# Patient Record
Sex: Female | Born: 1990
Health system: Southern US, Community
[De-identification: ages and names within clinical notes are randomized; demographics above are authoritative.]

## PROBLEM LIST (undated history)

## (undated) DIAGNOSIS — N361 Urethral diverticulum: Secondary | ICD-10-CM

## (undated) HISTORY — DX: Urethral diverticulum: N36.1

## (undated) HISTORY — PX: COSMETIC SURGERY: SHX468

---
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
## 2008-08-01 ENCOUNTER — Emergency Department (HOSPITAL_COMMUNITY): Admission: EM | Admit: 2008-08-01 | Discharge: 2008-08-01 | Payer: Self-pay | Admitting: Emergency Medicine

## 2011-09-11 ENCOUNTER — Emergency Department: Payer: Self-pay | Admitting: *Deleted

## 2011-09-11 LAB — URINALYSIS, COMPLETE
Bacteria: NONE SEEN
Bilirubin,UR: NEGATIVE
Ketone: NEGATIVE
Leukocyte Esterase: NEGATIVE
Nitrite: NEGATIVE
Protein: NEGATIVE
Specific Gravity: 1.018 (ref 1.003–1.030)
Squamous Epithelial: 5
WBC UR: 2 /HPF (ref 0–5)

## 2011-10-20 ENCOUNTER — Emergency Department (HOSPITAL_COMMUNITY): Payer: Self-pay

## 2011-10-20 ENCOUNTER — Encounter (HOSPITAL_COMMUNITY): Payer: Self-pay | Admitting: *Deleted

## 2011-10-20 ENCOUNTER — Emergency Department (HOSPITAL_COMMUNITY)
Admission: EM | Admit: 2011-10-20 | Discharge: 2011-10-20 | Disposition: A | Discharge status: Home / Self Care | Payer: Self-pay | Attending: Emergency Medicine | Admitting: Emergency Medicine

## 2011-10-20 DIAGNOSIS — Z3490 Encounter for supervision of normal pregnancy, unspecified, unspecified trimester: Secondary | ICD-10-CM

## 2011-10-20 DIAGNOSIS — IMO0002 Reserved for concepts with insufficient information to code with codable children: Secondary | ICD-10-CM | POA: Insufficient documentation

## 2011-10-20 DIAGNOSIS — S63509A Unspecified sprain of unspecified wrist, initial encounter: Secondary | ICD-10-CM | POA: Insufficient documentation

## 2011-10-20 DIAGNOSIS — S63501A Unspecified sprain of right wrist, initial encounter: Secondary | ICD-10-CM

## 2011-10-20 DIAGNOSIS — O219 Vomiting of pregnancy, unspecified: Secondary | ICD-10-CM | POA: Insufficient documentation

## 2011-10-20 DIAGNOSIS — S40212A Abrasion of left shoulder, initial encounter: Secondary | ICD-10-CM
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
## 2011-10-20 NOTE — ED Notes (Signed)
Orth tech notified of need for splint.

## 2011-10-20 NOTE — ED Notes (Signed)
D/c instructions reviewed w/ pt - pt denies any further questions or concerns at present.   
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
## 2012-10-22 ENCOUNTER — Emergency Department: Payer: Self-pay | Admitting: Internal Medicine

## 2012-10-22 LAB — URINALYSIS, COMPLETE
Bacteria: NONE SEEN
Glucose,UR: NEGATIVE mg/dL (ref 0–75)
Leukocyte Esterase: NEGATIVE
Nitrite: NEGATIVE
Ph: 6 (ref 4.5–8.0)
Protein: 30
Specific Gravity: 1.02 (ref 1.003–1.030)
Squamous Epithelial: 2

## 2012-10-23 ENCOUNTER — Emergency Department: Payer: Self-pay | Admitting: Unknown Physician Specialty

## 2014-04-12 ENCOUNTER — Encounter (HOSPITAL_COMMUNITY): Payer: Self-pay | Admitting: *Deleted

## 2015-05-11 ENCOUNTER — Emergency Department
Admission: EM | Admit: 2015-05-11 | Discharge: 2015-05-11 | Disposition: A | Discharge status: Home / Self Care | Payer: No Typology Code available for payment source | Attending: Student | Admitting: Student

## 2015-05-11 ENCOUNTER — Encounter: Payer: Self-pay | Admitting: *Deleted

## 2015-05-11 DIAGNOSIS — G44311 Acute post-traumatic headache, intractable: Secondary | ICD-10-CM | POA: Insufficient documentation

## 2015-05-11 DIAGNOSIS — Y92410 Unspecified street and highway as the place of occurrence of the external cause: Secondary | ICD-10-CM | POA: Diagnosis not present

## 2015-05-11 DIAGNOSIS — Z79899 Other long term (current) drug therapy: Secondary | ICD-10-CM | POA: Insufficient documentation

## 2015-05-11 DIAGNOSIS — S3992XA Unspecified injury of lower back, initial encounter: Secondary | ICD-10-CM | POA: Diagnosis present

## 2015-05-11 DIAGNOSIS — Y998 Other external cause status: Secondary | ICD-10-CM | POA: Insufficient documentation

## 2015-05-11 DIAGNOSIS — Y9389 Activity, other specified: Secondary | ICD-10-CM | POA: Insufficient documentation

## 2015-05-11 DIAGNOSIS — S199XXA Unspecified injury of neck, initial encounter: Secondary | ICD-10-CM | POA: Insufficient documentation

## 2015-05-11 DIAGNOSIS — M7918 Myalgia, other site: Secondary | ICD-10-CM

## 2015-05-11 MED ORDER — NAPROXEN 500 MG PO TABS: 500.0000 mg | ORAL_TABLET | Freq: Two times a day (BID) | ORAL | Status: AC

## 2015-05-11 MED ORDER — CYCLOBENZAPRINE HCL 10 MG PO TABS: 10.0000 mg | ORAL_TABLET | Freq: Three times a day (TID) | ORAL | Status: DC | PRN

## 2015-05-11 MED ORDER — TRAMADOL HCL 50 MG PO TABS: 50.0000 mg | ORAL_TABLET | Freq: Four times a day (QID) | ORAL | Status: DC | PRN

## 2015-05-11 NOTE — Discharge Instructions (Signed)
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
## 2016-01-06 ENCOUNTER — Encounter: Payer: Self-pay | Admitting: Urology

## 2016-01-06 ENCOUNTER — Ambulatory Visit: Payer: Self-pay | Admitting: Urology

## 2016-02-27 DIAGNOSIS — E559 Vitamin D deficiency, unspecified: Secondary | ICD-10-CM | POA: Insufficient documentation

## 2017-03-18 ENCOUNTER — Telehealth: Payer: Self-pay | Admitting: Obstetrics and Gynecology

## 2017-03-18 NOTE — Telephone Encounter (Signed)
Called and lvm for patient to schedule appointment, Our office received a referral from [facility]. No other information was disclosed. Will continue to reach out to patient to schedule future appointments. Thank you.

## 2017-03-19 ENCOUNTER — Ambulatory Visit (INDEPENDENT_AMBULATORY_CARE_PROVIDER_SITE_OTHER): Payer: Medicaid Other | Admitting: Obstetrics and Gynecology

## 2017-03-19 ENCOUNTER — Encounter: Payer: Self-pay | Admitting: Obstetrics and Gynecology

## 2017-03-19 VITALS — BP 94/60 | HR 87 | Ht 64.0 in | Wt 168.2 lb

## 2017-03-19 DIAGNOSIS — N361 Urethral diverticulum: Secondary | ICD-10-CM | POA: Diagnosis not present

## 2017-03-19 NOTE — Progress Notes (Signed)
HPI:      [name] is a 26 y.o. [phone] who LMP was Patient's last menstrual period was 03/07/2017 (approximate).  Subjective:   She presents today With 2 issues.  She palpated on her cervix a small bump and was diagnosed with a nabothian cyst. She would like to know management of this. Her main issue is which she describes as extra skin in the middle of her vagina. She states that this area occasionally gets filled up with something becomes more obvious. She also complains that occasionally and spontaneously she will have a gush of fluid flow from the vagina. She reports no other urinary difficulties. She has recently been tested for STDs and denies new sexual partners. She has an IUD for birth control.    Hx: The following portions of the patient's history were reviewed and updated as appropriate:             She  has no past medical history on file. She  does not have a problem list on file. She  has a past surgical history that includes Cholecystectomy. Her family history includes Diabetes in her maternal grandmother; Migraines in her maternal grandmother. She  reports that she has never smoked. She has never used smokeless tobacco. She reports that she drinks alcohol. She reports that she does not use drugs. She has No Known Allergies.       Review of Systems:  Review of Systems  Constitutional: Denied constitutional symptoms, night sweats, recent illness, fatigue, fever, insomnia and weight loss.  Eyes: Denied eye symptoms, eye pain, photophobia, vision change and visual disturbance.  Ears/Nose/Throat/Neck: Denied ear, nose, throat or neck symptoms, hearing loss, nasal discharge, sinus congestion and sore throat.  Cardiovascular: Denied cardiovascular symptoms, arrhythmia, chest pain/pressure, edema, exercise intolerance, orthopnea and palpitations.  Respiratory: Denied pulmonary symptoms, asthma, pleuritic pain, productive sputum, cough, dyspnea and wheezing.   Gastrointestinal: Denied, gastro-esophageal reflux, melena, nausea and vomiting.  Genitourinary: See HPI for additional information.  Musculoskeletal: Denied musculoskeletal symptoms, stiffness, swelling, muscle weakness and myalgia.  Dermatologic: Denied dermatology symptoms, rash and scar.  Neurologic: Denied neurology symptoms, dizziness, headache, neck pain and syncope.  Psychiatric: Denied psychiatric symptoms, anxiety and depression.  Endocrine: Denied endocrine symptoms including hot flashes and night sweats.   Meds:   No current outpatient prescriptions on file prior to visit.   No current facility-administered medications on file prior to visit.     Objective:     Vitals:   03/19/17 1041  BP: 94/60  Pulse: 87              Physical examination   Pelvic:   Vulva: Normal appearance.  No lesions.  Vagina: No lesions or abnormalities noted.  Support: Normal pelvic support.  Urethra Skin tag type tissue mass noted below urethra. No fluid noted from the urethra with pressure on the skin tag.   Strong possibility of urethral diverticulum .    Meatus Normal size without lesions or prolapse.  Cervix: Normal appearance.  No lesions. Small nabothian cyst noted.  IUD strings noted   Anus: Normal exam.  No lesions.  Perineum: Normal exam.  No lesions.        Bimanual   Uterus: Normal size.  Non-tender.  Mobile.  AV.  Adnexae: No masses.  Non-tender to palpation.  Cul-de-sac: Negative for abnormality.     Assessment:    G2P2002 There are no active problems to display for this patient.    1. Urethral diverticulum  Possible Skene's gland issue but I doubt this. The appearance and the history of leakage most consistent with urethral diverticulum.   Plan:            1.  Referral to urology for further evaluation.  2.  Reassured patient regarding nabothian cysts-she declined any type of management and I agree that no specific management is required. Orders Orders  Placed This Encounter  Procedures  . Ambulatory referral to Urology    No orders of the defined types were placed in this encounter.     F/U  No Follow-up on file. I spent 33 minutes with this patient of which greater than 50% was spent discussing her signs and symptoms, nabothian cysts, urethral diverticulum, possible management.  Elonda Husky, M.D. 03/19/2017 11:52 AM

## 2017-04-04 ENCOUNTER — Ambulatory Visit (INDEPENDENT_AMBULATORY_CARE_PROVIDER_SITE_OTHER): Payer: Medicaid Other | Admitting: Urology

## 2017-04-04 ENCOUNTER — Encounter: Payer: Self-pay | Admitting: Urology

## 2017-04-04 DIAGNOSIS — N361 Urethral diverticulum: Secondary | ICD-10-CM

## 2017-04-04 NOTE — Progress Notes (Signed)
04/04/2017 1:49 PM   Monique Luna September 29, 1990 [id number]  Referring provider: Linzie Collin, MD [address] [place], [place] [address]  Chief Complaint  Patient presents with  . Urethral Diverticulum    New patient    HPI: Monique Luna  is a 26 yo female seen in consultation at the request of Dr. Logan Bores for evaluation of a possible urethral diverticulum.  She has a greater than five-year history of a periurethral mass.  She states approximately 3 years ago the area filled up with fluid then ruptured.  Since that time she has redundant periurethral tissue and occasional feels like the leakage is present.  She will note dampness in her underwear of slightly tinged fluid but no blood.  She has no voiding complaints and specifically denies urinary frequency, urgency, dysuria or gross hematuria.  She denies pelvic pain or dyspareunia.  There is no history of previous or recurrent UTIs.   PMH: No past medical history on file.  Surgical History: Past Surgical History:  Procedure Laterality Date  . CHOLECYSTECTOMY      Home Medications:  Allergies as of 04/04/2017   No Known Allergies     Medication List       Accurate as of 04/04/17  1:49 PM. Always use your most recent med list.          PARAGARD INTRAUTERINE COPPER Iud IUD 1 each by Intrauterine route once.       Allergies: No Known Allergies  Family History: Family History  Problem Relation Age of Onset  . Diabetes Maternal Grandmother   . Migraines Maternal Grandmother     Social History:  reports that she has never smoked. She has never used smokeless tobacco. She reports that she drinks alcohol. She reports that she does not use drugs.  ROS: UROLOGY Frequent Urination?: No Hard to postpone urination?: No Burning/pain with urination?: No Get up at night to urinate?: Yes Leakage of urine?: Yes Urine stream starts and stops?: No Trouble starting stream?: No Do you have to  strain to urinate?: No Blood in urine?: No Urinary tract infection?: No Sexually transmitted disease?: No Injury to kidneys or bladder?: No Painful intercourse?: No Weak stream?: No Currently pregnant?: No Vaginal bleeding?: No Last menstrual period?: n  Gastrointestinal Nausea?: No Vomiting?: No Indigestion/heartburn?: No Diarrhea?: No Constipation?: No  Constitutional Fever: No Night sweats?: No Weight loss?: No Fatigue?: Yes  Skin Skin rash/lesions?: No Itching?: No  Eyes Blurred vision?: No Double vision?: No  Ears/Nose/Throat Sore throat?: No Sinus problems?: No  Hematologic/Lymphatic Swollen glands?: No Easy bruising?: No  Cardiovascular Leg swelling?: No Chest pain?: No  Respiratory Cough?: No Shortness of breath?: No  Endocrine Excessive thirst?: No  Musculoskeletal Back pain?: No Joint pain?: No  Neurological Headaches?: No Dizziness?: No  Psychologic Depression?: No Anxiety?: No  Physical Exam: BP 101/64   Pulse 90   Ht 5\' 5"  (1.651 m)   Wt 168 lb (76.2 kg)   LMP 03/07/2017 (Approximate)   BMI 27.96 kg/m   Constitutional:  Alert and oriented, No acute distress. HEENT: [place] AT, moist mucus membranes.  Trachea midline, no masses. Cardiovascular: No clubbing, cyanosis, or edema. Respiratory: Normal respiratory effort, no increased work of breathing. GI: Abdomen is soft, nontender, nondistended, no abdominal masses GU: No CVA tenderness.  Pelvic exam deferred today Skin: No rashes, bruises or suspicious lesions. Lymph: No cervical or inguinal adenopathy. Neurologic: Grossly intact, no focal deficits, moving all 4 extremities. Psychiatric: Normal mood  and affect.  Laboratory Data:  Urinalysis Lab Results  Component Value Date   APPEARANCEUR Clear 10/22/2012   LEUKOCYTESUR Negative 10/22/2012   PROTEINUR 30 mg/dL 16/10/960405/14/2014   GLUCOSEU Negative 10/22/2012   RBCU 1 /HPF 10/22/2012   BILIRUBINUR Negative 10/22/2012   NITRITE  Negative 10/22/2012    Lab Results  Component Value Date   BACTERIA NONE SEEN 10/22/2012     Assessment & Plan:    1. Urethral diverticulum We discussed urethral diverticulum and diagnosis.  It is difficult to actually diagnose without imaging and have recommended scheduling an MRI of the pelvis.  She will follow-up after the study for cystoscopy and pelvic exam.  - MR Pelvis W Wo Contrast; Future    Riki AltesScott C Trina Asch, MD    [facility] [address], [address] [place], [place] [id number] [phone]

## 2017-04-25 ENCOUNTER — Encounter: Payer: Self-pay | Admitting: Urology

## 2017-04-25 ENCOUNTER — Other Ambulatory Visit: Payer: Medicaid Other | Admitting: Urology

## 2017-05-03 ENCOUNTER — Ambulatory Visit: Payer: Medicaid Other

## 2017-05-10 ENCOUNTER — Ambulatory Visit
Admission: RE | Admit: 2017-05-10 | Discharge: 2017-05-10 | Disposition: A | Discharge status: Home / Self Care | Payer: Medicaid Other | Source: Ambulatory Visit | Attending: Urology | Admitting: Urology

## 2017-05-10 DIAGNOSIS — N361 Urethral diverticulum: Secondary | ICD-10-CM

## 2017-05-10 MED ORDER — GADOBENATE DIMEGLUMINE 529 MG/ML IV SOLN
15.0000 mL | Freq: Once | INTRAVENOUS | Status: AC | PRN
  Administered 2017-05-10: 15 mL via INTRAVENOUS

## 2017-05-17 ENCOUNTER — Encounter: Payer: Self-pay | Admitting: Urology

## 2017-05-17 ENCOUNTER — Ambulatory Visit (INDEPENDENT_AMBULATORY_CARE_PROVIDER_SITE_OTHER): Payer: Medicaid Other | Admitting: Urology

## 2017-05-17 VITALS — BP 119/81 | HR 90 | Ht 65.0 in | Wt 168.7 lb

## 2017-05-17 DIAGNOSIS — N2889 Other specified disorders of kidney and ureter: Secondary | ICD-10-CM

## 2017-05-17 LAB — URINALYSIS, COMPLETE
BILIRUBIN UA: NEGATIVE
GLUCOSE, UA: NEGATIVE
LEUKOCYTES UA: NEGATIVE
Nitrite, UA: NEGATIVE
Protein, UA: NEGATIVE
RBC UA: NEGATIVE
Urobilinogen, Ur: 0.2 mg/dL (ref 0.2–1.0)
pH, UA: 6 (ref 5.0–7.5)

## 2017-05-17 LAB — MICROSCOPIC EXAMINATION: RBC, UA: NONE SEEN /hpf (ref 0–?)

## 2017-05-17 MED ORDER — LIDOCAINE HCL 2 % EX GEL
1.0000 "application " | Freq: Once | CUTANEOUS | Status: AC
  Administered 2017-05-17: 1 via URETHRAL

## 2017-05-21 NOTE — Progress Notes (Signed)
   05/21/17  CC:  Chief Complaint  Patient presents with  . Cysto    HPI: Initially seen 04/04/2017 for a possible urethral diverticulum.  Blood pressure 119/81, pulse 90, height 5\' 5"  (1.651 m), weight 168 lb 11.2 oz (76.5 kg), unknown if currently breastfeeding. NED. A&Ox3.   Pelvic-saccular tissue posterior aspect of urethra  Pelvic MRI shows a small 4 x 7 mm posterior right-sided urethral diverticulum  Cystoscopy Procedure Note  Patient identification was confirmed, informed consent was obtained, and patient was prepped using Betadine solution.  Lidocaine jelly was administered per urethral meatus.    Preoperative abx where received prior to procedure.    Procedure: - Flexible cystoscope introduced, without any difficulty.   - Thorough search of the bladder revealed:    normal urethral meatus-no os of urethral diverticulum visualized    normal urothelium    no stones    no ulcers     no tumors    no urethral polyps    no trabeculation  - Ureteral orifices were normal in position and appearance.  Post-Procedure: - Patient tolerated the procedure well  Assessment/ Plan: Small urethral diverticulum.  Will have her see Dr. Sherron MondayMacDiarmid next to discuss management options.    Riki AltesScott C Diogo Anne, MD

## 2017-06-24 ENCOUNTER — Ambulatory Visit: Payer: Self-pay | Admitting: Urology

## 2017-07-01 ENCOUNTER — Encounter: Payer: Self-pay | Admitting: Urology

## 2017-07-01 ENCOUNTER — Ambulatory Visit: Payer: Medicaid Other | Admitting: Urology

## 2017-08-19 ENCOUNTER — Telehealth: Payer: Self-pay | Admitting: Urology

## 2017-08-19 ENCOUNTER — Ambulatory Visit: Payer: Medicaid Other | Admitting: Urology

## 2017-08-19 NOTE — Telephone Encounter (Signed)
Pt left message with after hours asking to cancel/resched appt, I attempted to reach out/call pt x3 over a span of a lil over an hr, all of which were busy signals.  FYI did attempt to call to reschedule.

## 2017-10-07 ENCOUNTER — Encounter: Payer: Self-pay | Admitting: Urology

## 2017-10-07 ENCOUNTER — Ambulatory Visit (INDEPENDENT_AMBULATORY_CARE_PROVIDER_SITE_OTHER): Payer: Medicaid Other | Admitting: Urology

## 2017-10-07 VITALS — BP 105/68 | HR 84 | Resp 16 | Ht 65.0 in | Wt 163.2 lb

## 2017-10-07 DIAGNOSIS — N2889 Other specified disorders of kidney and ureter: Secondary | ICD-10-CM

## 2017-10-07 LAB — URINALYSIS, COMPLETE
BILIRUBIN UA: NEGATIVE
Glucose, UA: NEGATIVE
Ketones, UA: NEGATIVE
Leukocytes, UA: NEGATIVE
NITRITE UA: NEGATIVE
RBC UA: NEGATIVE
Specific Gravity, UA: >1.03 — ABNORMAL HIGH (ref 1.005–1.030)
UUROB: 0.2 mg/dL (ref 0.2–1.0)
pH, UA: 5.5 (ref 5.0–7.5)

## 2017-10-07 LAB — MICROSCOPIC EXAMINATION: WBC UA: NONE SEEN /HPF (ref 0–5)

## 2017-10-07 NOTE — Progress Notes (Signed)
10/07/2017 3:36 PM   Monique Luna January 30, 1991 [id number]  Referring provider: No referring provider defined for this encounter.  No chief complaint on file.   HPI: Dr Lonna Cobb: Monique Luna  is a 27 yo female seen in consultation at the request of Dr. Logan Bores for evaluation of a possible urethral diverticulum.  She has a greater than five-year history of a periurethral mass.  She states approximately 3 years ago the area filled up with fluid then ruptured.  Since that time she has redundant periurethral tissue and occasional feels like the leakage is present.  She will note dampness in her underwear of slightly tinged fluid but no blood.  She has no voiding complaints and specifically denies urinary frequency, urgency, dysuria or gross hematuria.  She denies pelvic pain or dyspareunia.  There is no history of previous or recurrent UTIs.  Cystoscopy December 7 was normal.  Urinary Tract: No distal hydroureter. Normal urinary bladder. A diminutive, approximately 7 x 4 mm cystic structure about the 7-8 o'clock position of the urethra including on image 25/ series 3 and 16/series 6. Not well evaluated after contrast secondary to size. No gross abnormal enhancement in this area. IMPRESSION: 7 x 4 mm posterior right-sided urethral diverticulum.  Patient describes twice perhaps a swelling or cyst in the vagina especially 10 years ago when she was pregnant.  She describes it draining twice the last being at least 3 years ago.  She does not feel a cyst now  She denies urge incontinence and stress incontinence but randomly has leakage not associated with awareness.  She will change the liner each time she voids sometimes damp sometimes more wet  Modifying factors: There are no other modifying factors  Associated signs and symptoms: There are no other associated signs and symptoms Aggravating and relieving factors: There are no other aggravating or relieving factors Severity:  Moderate Duration: Persistent      PMH: Past Medical History:  Diagnosis Date  . Urethral diverticulum     Surgical History: Past Surgical History:  Procedure Laterality Date  . CHOLECYSTECTOMY  2009   [facility]    Home Medications:  Allergies as of 10/07/2017   No Known Allergies     Medication List        Accurate as of 10/07/17  3:36 PM. Always use your most recent med list.          emtricitabine-tenofovir 200-300 MG tablet Commonly known as:  TRUVADA Take 1 tablet by mouth daily.   PARAGARD INTRAUTERINE COPPER Iud IUD 1 each by Intrauterine route once. Placed in 2018       Allergies: No Known Allergies  Family History: Family History  Problem Relation Age of Onset  . Diabetes Maternal Grandmother   . Migraines Maternal Grandmother   . Healthy Father   . Healthy Mother   . Healthy Sister   . Healthy Sister     Social History:  reports that she has never smoked. She has never used smokeless tobacco. She reports that she drinks alcohol. She reports that she does not use drugs.  ROS: UROLOGY Frequent Urination?: No Hard to postpone urination?: No Burning/pain with urination?: No Get up at night to urinate?: No Leakage of urine?: Yes Urine stream starts and stops?: No Trouble starting stream?: No Do you have to strain to urinate?: No Blood in urine?: No Urinary tract infection?: No Sexually transmitted disease?: No Injury to kidneys or bladder?: No Painful intercourse?: Yes Weak stream?: No Currently pregnant?:  No Vaginal bleeding?: No Last menstrual period?: n  Gastrointestinal Nausea?: No Vomiting?: No Indigestion/heartburn?: No Diarrhea?: No Constipation?: No  Constitutional Fever: No Night sweats?: No Weight loss?: No Fatigue?: No  Skin Skin rash/lesions?: No Itching?: No  Eyes Blurred vision?: No Double vision?: No  Ears/Nose/Throat Sore throat?: No Sinus problems?: No  Hematologic/Lymphatic Swollen  glands?: No Easy bruising?: No  Cardiovascular Leg swelling?: No Chest pain?: No  Respiratory Cough?: No Shortness of breath?: No  Endocrine Excessive thirst?: No  Musculoskeletal Back pain?: No Joint pain?: No  Neurological Headaches?: No Dizziness?: No  Psychologic Depression?: No Anxiety?: No  Physical Exam: BP 105/68   Pulse 84   Resp 16   Ht  (1.651 m)   Wt 163 lb 3.2 oz (74 kg)   SpO2 98%   BMI 27.16 kg/m   Constitutional:  Alert and oriented, No acute distress. HEENT: [place] AT, moist mucus membranes.  Trachea midline, no masses. Cardiovascular: No clubbing, cyanosis, or edema. Respiratory: Normal respiratory effort, no increased work of breathing. GI: Abdomen is soft, nontender, nondistended, no abdominal masses GU: The patient had a well supported bladder neck and no prolapse or stress incontinence.  She had a small swelling approximately 7 or 8 mm x 3 or 4 mm at the level of the urethral meatus in keeping with a Skene's duct cyst.  It was nontender and I could not express it Skin: No rashes, bruises or suspicious lesions. Lymph: No cervical or inguinal adenopathy. Neurologic: Grossly intact, no focal deficits, moving all 4 extremities. Psychiatric: Normal mood and affect.  Laboratory Data:  Urinalysis    Component Value Date/Time   COLORURINE Yellow 10/22/2012 1845   APPEARANCEUR Clear 05/17/2017 1622   LABSPEC 1.020 10/22/2012 1845   PHURINE 6.0 10/22/2012 1845   GLUCOSEU Negative 05/17/2017 1622   GLUCOSEU Negative 10/22/2012 1845   HGBUR Negative 10/22/2012 1845   BILIRUBINUR Negative 05/17/2017 1622   BILIRUBINUR Negative 10/22/2012 1845   KETONESUR Negative 10/22/2012 1845   PROTEINUR Negative 05/17/2017 1622   PROTEINUR 30 mg/dL 66/44/0347 4259   NITRITE Negative 05/17/2017 1622   NITRITE Negative 10/22/2012 1845   LEUKOCYTESUR Negative 05/17/2017 1622   LEUKOCYTESUR Negative 10/22/2012 1845    Pertinent Imaging: As  noted  Assessment & Plan: The patient by MRI and history appears to have had or has a urethral diverticulum.  She does have leakage not associated with awareness.  I reviewed the MRI and I do believe the findings coincide with the physical examination.  My concern is that if she has a diverticulum separate from what I saw at the meatus it is too small to treat at this stage.  The distal finding if not a diverticulum should not cause urinary incontinence.  It would be very rare for diverticulum to be there is distal.  I can see why she does have a spraying when she voids.  A picture was drawn.  The role of Myrbetriq discussed.  The future role of urodynamics discussed.  Reviewing the x-rays with the radiologist considered.  Patient understands that we do not want to surgery and not help her symptoms.  If she fails the Myrbetriq samples and she wishes to proceed we will then order urodynamics and she agreed  1. Urethral diverticulum 2.  Urinary incontinence not associated with awareness - Urinalysis, Complete   No follow-ups on file.  Martina Sinner, MD  [facility] [address] [place], [place] [address] [phone]

## 2017-11-11 ENCOUNTER — Encounter: Payer: Self-pay | Admitting: Urology

## 2017-11-11 ENCOUNTER — Ambulatory Visit: Payer: Medicaid Other | Admitting: Urology

## 2018-09-05 ENCOUNTER — Other Ambulatory Visit (HOSPITAL_COMMUNITY): Payer: Self-pay | Admitting: Family Medicine

## 2018-09-10 LAB — GONOCOCCUS CULTURE

## 2019-08-21 ENCOUNTER — Other Ambulatory Visit: Payer: Self-pay | Admitting: Physician Assistant

## 2019-08-21 DIAGNOSIS — R102 Pelvic and perineal pain: Secondary | ICD-10-CM

## 2019-08-27 ENCOUNTER — Other Ambulatory Visit: Payer: Self-pay

## 2019-08-27 ENCOUNTER — Ambulatory Visit
Admission: RE | Admit: 2019-08-27 | Discharge: 2019-08-27 | Disposition: A | Discharge status: Home / Self Care | Payer: Medicaid Other | Source: Ambulatory Visit | Attending: Physician Assistant | Admitting: Physician Assistant

## 2019-08-27 DIAGNOSIS — R102 Pelvic and perineal pain: Secondary | ICD-10-CM | POA: Diagnosis not present

## 2020-05-25 ENCOUNTER — Other Ambulatory Visit: Payer: Medicaid Other

## 2020-05-25 DIAGNOSIS — Z20822 Contact with and (suspected) exposure to covid-19: Secondary | ICD-10-CM

## 2020-05-26 LAB — NOVEL CORONAVIRUS, NAA: SARS-CoV-2, NAA: NOT DETECTED

## 2020-05-26 LAB — SARS-COV-2, NAA 2 DAY TAT

## 2020-06-08 ENCOUNTER — Other Ambulatory Visit: Payer: Medicaid Other

## 2020-07-26 ENCOUNTER — Ambulatory Visit: Payer: Self-pay

## 2020-07-26 DIAGNOSIS — Z1389 Encounter for screening for other disorder: Secondary | ICD-10-CM | POA: Diagnosis not present

## 2020-07-26 DIAGNOSIS — N6459 Other signs and symptoms in breast: Secondary | ICD-10-CM | POA: Diagnosis not present

## 2020-07-26 DIAGNOSIS — B373 Candidiasis of vulva and vagina: Secondary | ICD-10-CM | POA: Diagnosis not present

## 2020-08-19 IMAGING — US US PELVIS COMPLETE WITH TRANSVAGINAL
1 series · 14 of 25 positions shown · non-contrast
Comparison: 09/30/2008

CLINICAL DATA: Chronic pelvic pain, dyspareunia

EXAM:
TRANSABDOMINAL AND TRANSVAGINAL ULTRASOUND OF PELVIS
TECHNIQUE: Both transabdominal and transvaginal ultrasound examinations of the
pelvis were performed. Transabdominal technique was performed for
global imaging of the pelvis including uterus, ovaries, adnexal
regions, and pelvic cul-de-sac. It was necessary to proceed with
endovaginal exam following the transabdominal exam to visualize the
endometrium and ovaries.

[Series 1: us pelvis complete with transvaginal · 0.18mm/px · 14 of 89 slices shown]
[im 1/89]
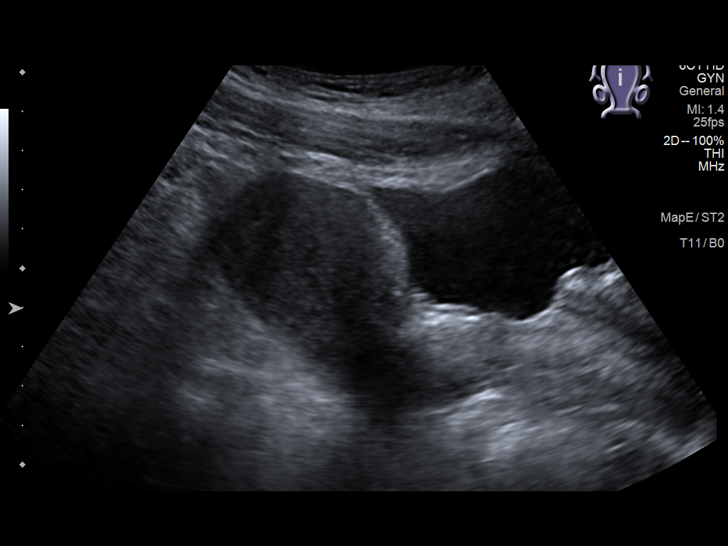
[im 8/89]
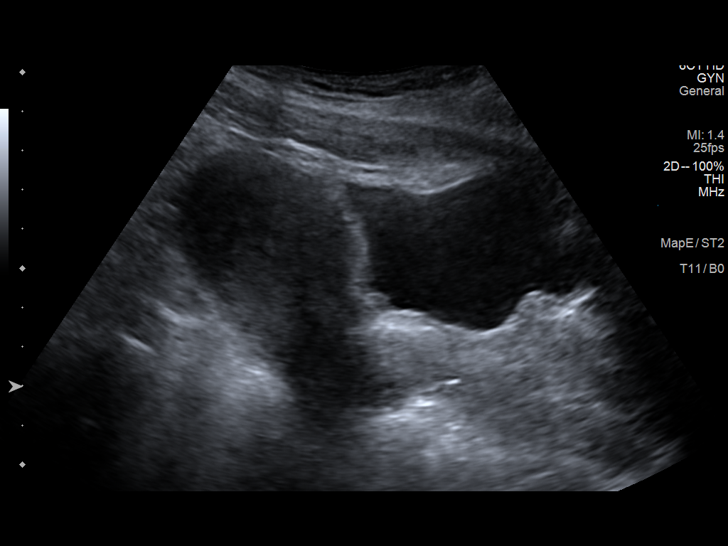
[im 15/89]
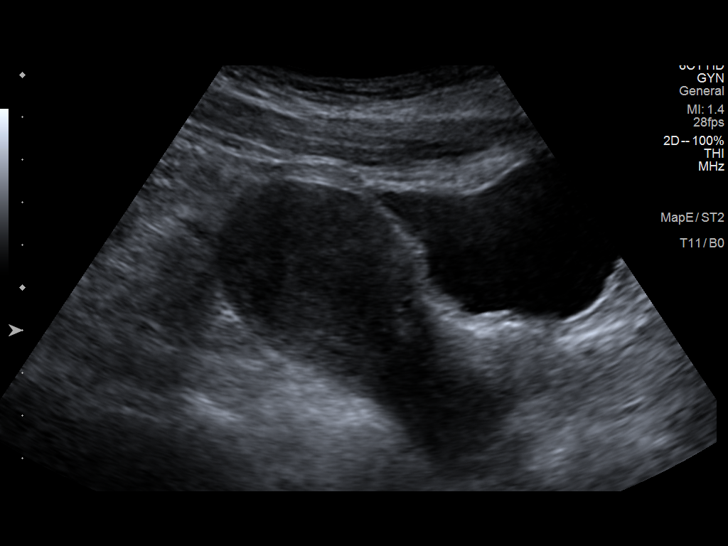
[im 23/89]
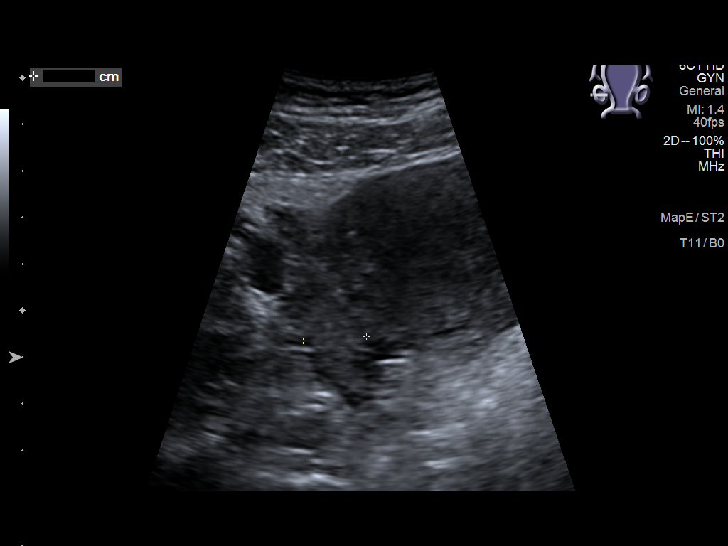
[im 30/89]
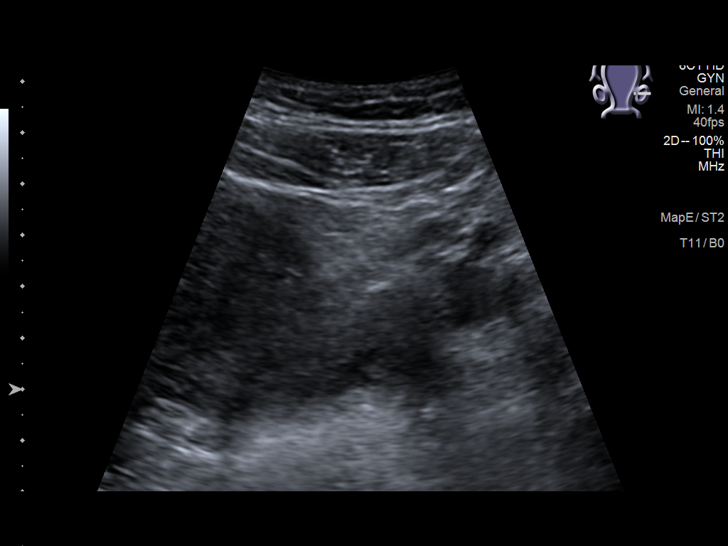
[im 34/89]
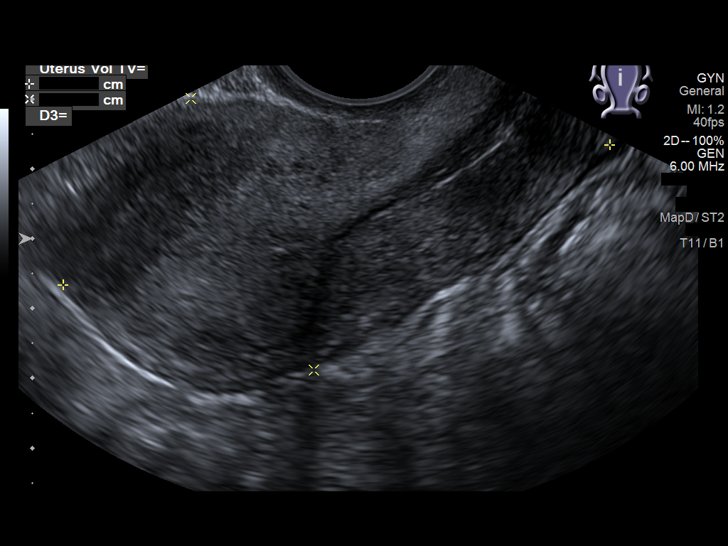
[im 41/89]
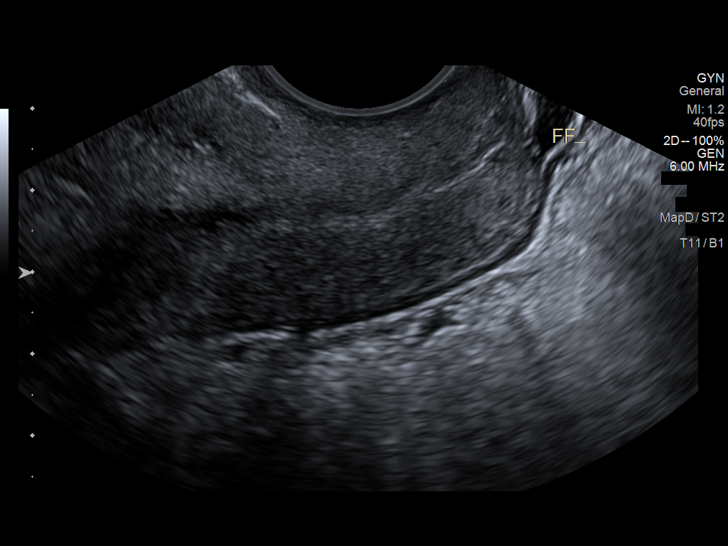
[im 48/89]
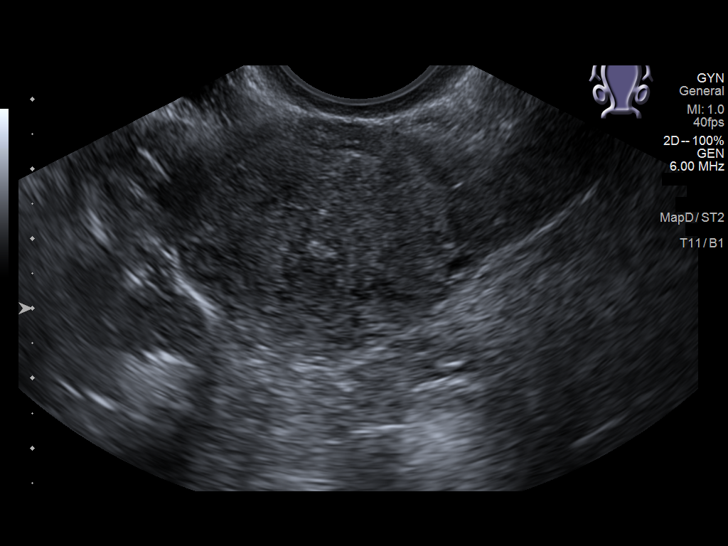
[im 56/89]
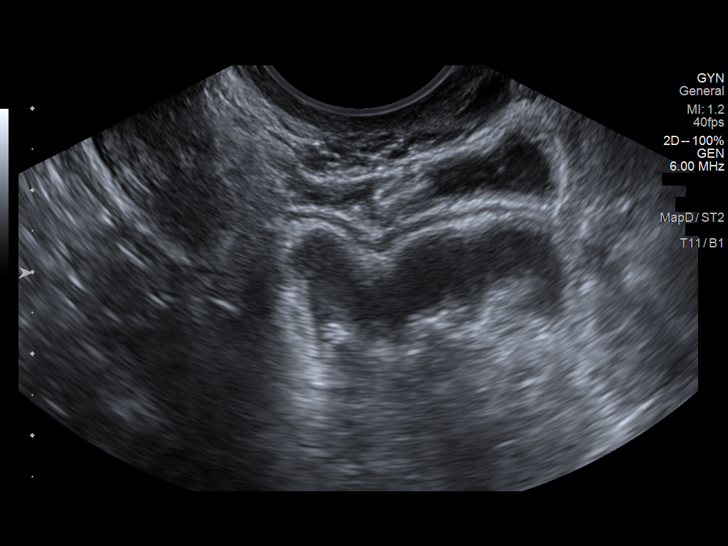
[im 59/89]
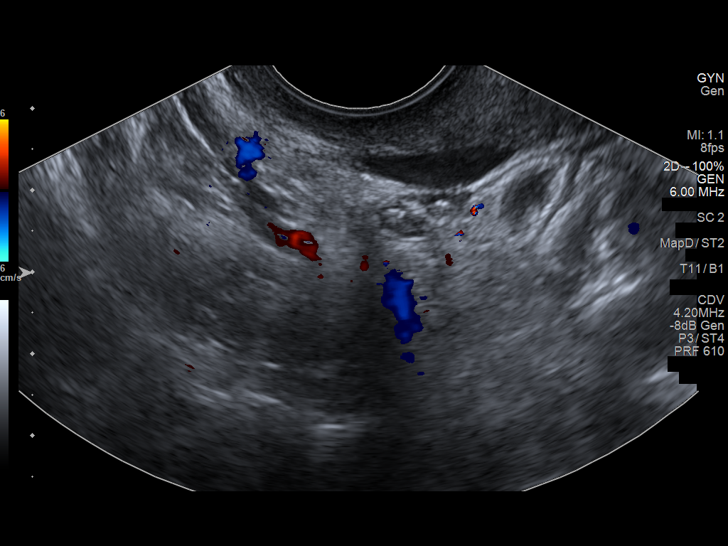
[im 67/89]
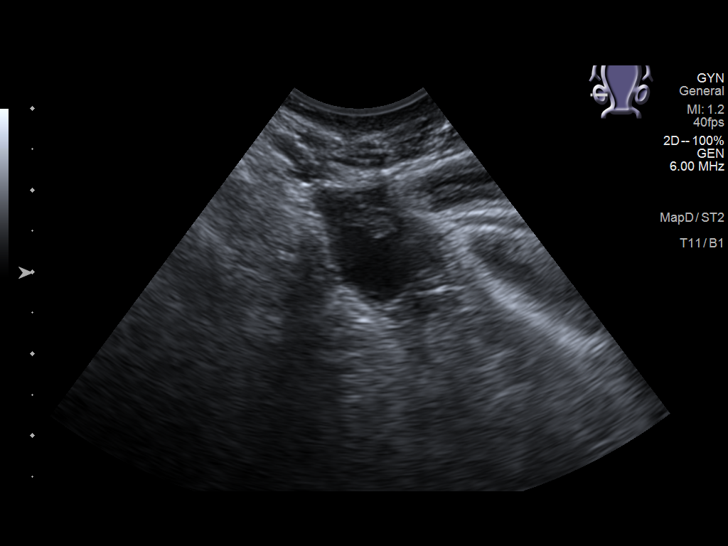
[im 74/89]
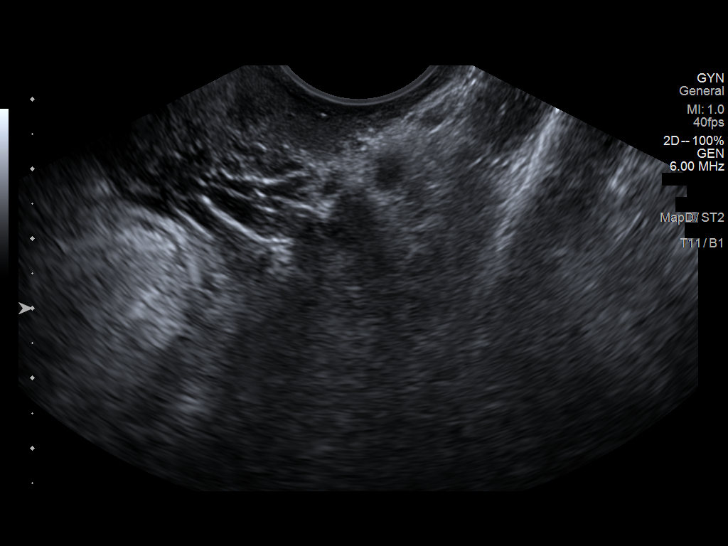
[im 81/89]
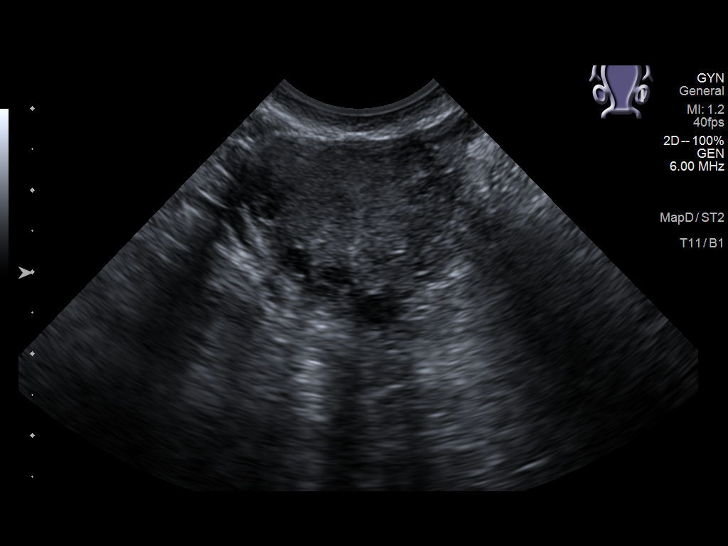
[im 89/89]
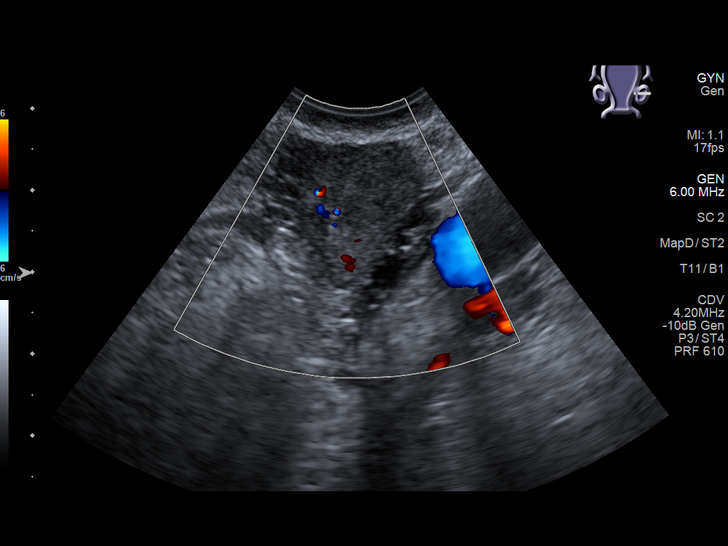

[14 of 25 positions shown; findings below may reference images not displayed]

FINDINGS: Uterus

Measurements: 8.1 x 4.3 x 4.7 cm = volume: 86 mL. Anteverted. Normal
morphology without mass

Endometrium

Thickness: 2 mm.  No endometrial fluid or focal abnormality

Right ovary

Measurements: 3.9 x 2.1 x 1.9 cm = volume: 8.1 mL. Normal morphology
without mass

Left ovary

Measurements: 2.5 x 2.0 x 2.3 cm = volume: 6.1 mL. Normal morphology
without mass

Other findings

Trace free pelvic fluid, likely physiologic.  No adnexal masses.
IMPRESSION: Normal exam.

## 2020-09-07 DIAGNOSIS — B373 Candidiasis of vulva and vagina: Secondary | ICD-10-CM | POA: Diagnosis not present

## 2020-09-07 DIAGNOSIS — N6459 Other signs and symptoms in breast: Secondary | ICD-10-CM | POA: Diagnosis not present

## 2020-10-26 ENCOUNTER — Encounter: Payer: Self-pay | Admitting: Plastic Surgery

## 2020-10-26 ENCOUNTER — Other Ambulatory Visit: Payer: Self-pay

## 2020-10-26 ENCOUNTER — Ambulatory Visit: Payer: 59 | Admitting: Plastic Surgery

## 2020-10-26 VITALS — BP 117/77 | HR 85 | Ht 65.0 in | Wt 190.0 lb

## 2020-10-26 DIAGNOSIS — M545 Low back pain, unspecified: Secondary | ICD-10-CM | POA: Diagnosis not present

## 2020-10-26 DIAGNOSIS — M546 Pain in thoracic spine: Secondary | ICD-10-CM | POA: Diagnosis not present

## 2020-10-26 DIAGNOSIS — M4004 Postural kyphosis, thoracic region: Secondary | ICD-10-CM

## 2020-10-26 DIAGNOSIS — N62 Hypertrophy of breast: Secondary | ICD-10-CM

## 2020-10-26 NOTE — Progress Notes (Signed)
Referring Provider Gorden Harms, PA-C [address] [place],  [place] [address]   CC:  Chief Complaint  Patient presents with  . consult      Monique Luna is an 30 y.o. female.  HPI: Patient presents to discuss breast reduction.  She has had years of back pain, neck pain and shoulder grooving related to her large breasts.  She tried over-the-counter medications, warm packs, cold packs and supportive bras with little relief.  She also gets rashes beneath her breast that have been refractory to over-the-counter treatments.  She has no family history of breast cancer and no previous breast biopsies or procedures.  She does not smoke and is not a diabetic.  She is interested in surgical treatment.  No Known Allergies  Outpatient Encounter Medications as of 10/26/2020  Medication Sig  . emtricitabine-tenofovir (TRUVADA) 200-300 MG tablet Take 1 tablet by mouth daily.  Marland Kitchen PARAGARD INTRAUTERINE COPPER IUD IUD 1 each by Intrauterine route once. Placed in 2018   No facility-administered encounter medications on file as of 10/26/2020.     Past Medical History:  Diagnosis Date  . Urethral diverticulum     Past Surgical History:  Procedure Laterality Date  . CHOLECYSTECTOMY  2009   [facility]    Family History  Problem Relation Age of Onset  . Diabetes Maternal Grandmother   . Migraines Maternal Grandmother   . Healthy Father   . Healthy Mother   . Healthy Sister   . Healthy Sister     Social History   Social History Narrative  . Not on file     Review of Systems General: Denies fevers, chills, weight loss CV: Denies chest pain, shortness of breath, palpitations  Physical Exam Vitals with BMI 10/26/2020 10/07/2017 05/17/2017  Height 5\' 5"  5\' 5"  5\' 5"   Weight 190 lbs 163 lbs 3 oz 168 lbs 11 oz  BMI 31.62 27.16 28.07  Systolic 117 105  Diastolic 77 68 81  Pulse 85 84 90    General:  No acute distress,  Alert and oriented, Non-Toxic, Normal speech and  affect Breast: She has grade 3 ptosis.  Sternal notch to nipple is 42 cm on the right and 41 cm on the left.  Nipple to fold is 20 cm bilaterally.  She seems a little bit bigger on the right side.  Assessment/Plan The patient has bilateral symptomatic macromastia.  She is a good candidate for a breast reduction.  She is interested in pursuing surgical treatment.  She has tried supportive garments and fitted bras with no relief.  The details of breast reduction surgery were discussed.  I explained the procedure in detail along the with the expected scars.  The risks were discussed in detail and include bleeding, infection, damage to surrounding structures, need for additional procedures, nipple loss, change in nipple sensation, persistent pain, contour irregularities and asymmetries.  I explained that breast feeding is often not possible after breast reduction surgery.  We discussed the expected postoperative course with an overall recovery period of about 1 month.  She demonstrated full understanding of all risks.  We discussed her personal risk factors that include the length of her breasts.  I discussed the free nipple graft technique with her.  She is a little bit hesitant to lose nipple sensation.  I did explain to her that there would be a limit to how small I could make her doing a traditional pedicled reduction but that it could be done if that  was her preference.  I also advised her that the areolar pigmentation may be irregular with a free nipple graft.  She is going to think about this and get back to [place] in terms of the type of technique that she would prefer..  The patient is interested in pursuing surgical treatment.  I anticipate approximately 1000g of tissue removed from each side.   Allena Napoleon 10/26/2020, 2:40 PM

## 2020-11-10 ENCOUNTER — Encounter: Payer: Self-pay | Admitting: Plastic Surgery

## 2020-11-11 NOTE — Telephone Encounter (Signed)
Thank you! This has already been handled.

## 2020-12-01 DIAGNOSIS — Z0000 Encounter for general adult medical examination without abnormal findings: Secondary | ICD-10-CM | POA: Diagnosis not present

## 2020-12-01 DIAGNOSIS — R109 Unspecified abdominal pain: Secondary | ICD-10-CM | POA: Diagnosis not present

## 2020-12-01 DIAGNOSIS — D509 Iron deficiency anemia, unspecified: Secondary | ICD-10-CM | POA: Diagnosis not present

## 2020-12-01 DIAGNOSIS — Z1159 Encounter for screening for other viral diseases: Secondary | ICD-10-CM | POA: Diagnosis not present

## 2020-12-01 DIAGNOSIS — Z113 Encounter for screening for infections with a predominantly sexual mode of transmission: Secondary | ICD-10-CM | POA: Diagnosis not present

## 2021-02-15 ENCOUNTER — Ambulatory Visit: Payer: 59 | Admitting: Plastic Surgery

## 2021-02-22 ENCOUNTER — Other Ambulatory Visit: Payer: Self-pay

## 2021-02-22 ENCOUNTER — Ambulatory Visit (INDEPENDENT_AMBULATORY_CARE_PROVIDER_SITE_OTHER): Payer: 59 | Admitting: Plastic Surgery

## 2021-02-22 DIAGNOSIS — N62 Hypertrophy of breast: Secondary | ICD-10-CM | POA: Diagnosis not present

## 2021-02-22 NOTE — Progress Notes (Signed)
Patient presents as a follow-up to discuss abdominal contouring in conjunction with her upcoming breast reduction surgery.  She is also decided that she would rather not have the free nipple graft and would like a traditional pedicle breast reduction.  Regarding to her abdomen she is bothered by the contour.  She has had 2 children with vaginal deliveries.  She had laparoscopic cholecystectomy but no other abdominal operations.  She does not smoke and is not a diabetic.  On exam abdomen is soft and nontender.  No obvious hernias.  She has likely rectus diastases.  She has mild to moderate excess skin and adipose tissue.  Had a long discussion with her about her options for her abdomen.  I explained that in my opinion liposuction alone would not be the best solution to address her contour.  I brought up abdominoplasty which would enable me to tighten the muscles and remove excess skin and certainly give her a better result.  She is considering having more children in the future.  She is not interested in abdominoplasty at this time.  She is content to just perform a breast reduction on its own and then see if she can get her abdominal contour better at the gym.  She will plan to readdress her abdomen some point in the future.  All of her questions were answered.

## 2021-03-09 ENCOUNTER — Encounter: Payer: 59 | Admitting: Plastic Surgery

## 2021-03-09 NOTE — Progress Notes (Signed)
Patient ID: Monique Luna, female    DOB: 1991/01/15, 30 y.o.   MRN: [id number]  Chief Complaint  Patient presents with   Pre-op Exam      ICD-10-CM   1. Macromastia  N62        History of Present Illness: Monique Luna is a 30 y.o.  female  with a history of macromastia.  She presents for preoperative evaluation for upcoming procedure, traditional pedicle breast reduction, scheduled for 03/24/2021 with Dr. Claudia Desanctis.  The patient has not had problems with anesthesia.  Patient previously had cholecystectomy without complication from anesthesia.  No personal family history of clots or clotting disorder.  No mammogram history.  No family history of breast cancer.  She does take iron over-the-counter, but has never had formal iron testing completed and reports hemoglobin greater than 11.  She is not take any other medications or over-the-counter supplements/herbs.  She tells me that she does not want her NACs to be too small.  Preferably 4-5 cm diameter.  Summary of Previous Visit: She recently met with Dr. Claudia Desanctis 02/22/2021 to discuss possible abdominal procedures to help with her likely rectus diastases.  Discussed abdominoplasty, but she is not interested at this time.  Decided to proceed with pedicled breast reduction rather than free nipple graft.  She was first seen for initial consult on 10/26/2020.  At that time, she complained of chronic back, neck, and shoulder pain.  She also reported inframammary rashes.  Anticipated 1000 g of tissue removed from each side.   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  Evidently she already has 5 weeks FMLA.  PMH Significant for: Cholecystectomy, symptomatic macromastia.   Past Medical History: Allergies: No Known Allergies  Current Medications: No current outpatient medications on file.  Past Medical Problems: Past Medical History:  Diagnosis Date   Urethral diverticulum     Past Surgical History: Past Surgical History:  Procedure Laterality Date    CHOLECYSTECTOMY  2009   [facility]    Social History: Social History   Socioeconomic History   Marital status: Single    Spouse name: Not on file   Number of children: Not on file   Years of education: Not on file   Highest education level: Not on file  Occupational History   Not on file  Tobacco Use   Smoking status: Never   Smokeless tobacco: Never  Vaping Use   Vaping Use: Never used  Substance and Sexual Activity   Alcohol use: Yes    Comment: Rarely   Drug use: No   Sexual activity: Yes    Birth control/protection: I.U.D.  Other Topics Concern   Not on file  Social History Narrative   Not on file   Social Determinants of Health   Financial Resource Strain: Not on file  Food Insecurity: Not on file  Transportation Needs: Not on file  Physical Activity: Not on file  Stress: Not on file  Social Connections: Not on file  Intimate Partner Violence: Not on file    Family History: Family History  Problem Relation Age of Onset   Diabetes Maternal Grandmother    Migraines Maternal Grandmother    Healthy Father    Healthy Mother    Healthy Sister    Healthy Sister     Review of Systems: Review of Systems  Constitutional:  Negative for fever.   Physical Exam: Vital Signs There were no vitals taken for this visit.  Physical Exam  Constitutional:  General: Not in acute distress.    Appearance: Normal appearance. Not ill-appearing.  HENT:     Head: Normocephalic and atraumatic.  Eyes:     Pupils: Pupils are equal, round Neck:     Musculoskeletal: Normal range of motion.  Cardiovascular:     Rate and Rhythm: Normal rate    Pulses: Normal pulses.  Pulmonary:     Effort: Pulmonary effort is normal. No respiratory distress.  Abdominal:     General: Abdomen is flat. There is no distension.  Musculoskeletal: Normal range of motion.  No lower extremity swelling or edema.  No varicosities noted.  Peripheral pulses intact and symmetric. Skin:     General: Skin is warm and dry.     Findings: No erythema or rash.  Neurological:     General: No focal deficit present.     Mental Status: Alert and oriented to [place], place, and time. Mental status is at baseline.     Motor: No weakness.  Psychiatric:        Mood and Affect: Mood normal.        Behavior: Behavior normal.    Assessment/Plan: The patient is scheduled for breast reduction 03/24/2021 with Dr. Claudia Desanctis.  Risks, benefits, and alternatives of procedure discussed, questions answered and consent obtained.    Smoking Status: Non-smoker. Last Mammogram: None.  No family history of breast cancer.  Caprini Score: 3; Risk Factors include: BMI > 25 and length of planned surgery. Recommendation for mechanical prophylaxis. Encourage early ambulation.   Pictures obtained: At consult 10/26/2020  Post-op Rx sent to pharmacy: Norco, Zofran.  Patient was provided with the General Surgical Risk consent document and Pain Medication Agreement prior to their appointment.  They had adequate time to read through the risk consent documents and Pain Medication Agreement. We also discussed them in [place] together during this preop appointment. All of their questions were answered to their satisfaction.  Recommended calling if they have any further questions.  Risk consent form and Pain Medication Agreement to be scanned into patient's chart.  The risk that can be encountered with breast reduction were discussed and include the following but not limited to these:  Breast asymmetry, fluid accumulation, firmness of the breast, inability to breast feed, loss of nipple or areola, skin loss, decrease or no nipple sensation, fat necrosis of the breast tissue, bleeding, infection, healing delay.  There are risks of anesthesia, changes to skin sensation and injury to nerves or blood vessels.  The muscle can be temporarily or permanently injured.  You may have an allergic reaction to tape, suture, glue, blood products  which can result in skin discoloration, swelling, pain, skin lesions, poor healing.  Any of these can lead to the need for revisonal surgery or stage procedures.  A reduction has potential to interfere with diagnostic procedures.  Nipple or breast piercing can increase risks of infection.  This procedure is best done when the breast is fully developed.  Changes in the breast will continue to occur over time.  Pregnancy can alter the outcomes of previous breast reduction surgery, weight gain and weigh loss can also effect the long term appearance.   We discussed the possibility of amputation/free nipple graft technique due to the length of her STN.  She is understanding of the possibility that we would need to transition from a pedicle technique to a free nipple graft technique intraoperatively.  We discussed the risks associated with free nipple graft breast reductions, including but not limited to  failure of the graft, partial loss of the graft, loss of sensation of bilateral nipple areola, complete loss of the nipple areola graft, inability to breast-feed, postoperative wounds, ongoing wound care.  We also discussed the risks associated with the pedicle technique.  We discussed that with the pedicle technique she could develop nipple areolar necrosis which would result in loss of the nipple, this would also result in ongoing wound care and possible changes in the shape of her breast.     Electronically signed by: Krista Blue, PA-C 03/14/2021 4:17 PM

## 2021-03-09 NOTE — H&P (View-Only) (Signed)
Patient ID: Monique Luna, female    DOB: [birth year], 30 y.o.   MRN: [id number]  Chief Complaint  Patient presents with   Pre-op Exam      ICD-10-CM   1. Macromastia  N62        History of Present Illness: Monique Luna is a 30 y.o.  female  with a history of macromastia.  She presents for preoperative evaluation for upcoming procedure, traditional pedicle breast reduction, scheduled for 03/24/2021 with Dr. Claudia Desanctis.  The patient has not had problems with anesthesia.  Patient previously had cholecystectomy without complication from anesthesia.  No personal family history of clots or clotting disorder.  No mammogram history.  No family history of breast cancer.  She does take iron over-the-counter, but has never had formal iron testing completed and reports hemoglobin greater than 11.  She is not take any other medications or over-the-counter supplements/herbs.  She tells me that she does not want her NACs to be too small.  Preferably 4-5 cm diameter.  Summary of Previous Visit: She recently met with Dr. Claudia Desanctis 02/22/2021 to discuss possible abdominal procedures to help with her likely rectus diastases.  Discussed abdominoplasty, but she is not interested at this time.  Decided to proceed with pedicled breast reduction rather than free nipple graft.  She was first seen for initial consult on 10/26/2020.  At that time, she complained of chronic back, neck, and shoulder pain.  She also reported inframammary rashes.  Anticipated 1000 g of tissue removed from each side.   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  PMH Significant for: Cholecystectomy, symptomatic macromastia.   Past Medical History: Allergies: No Known Allergies  Current Medications: No current outpatient medications on file.  Past Medical Problems: Past Medical History:  Diagnosis Date   Urethral diverticulum     Past Surgical History: Past Surgical History:  Procedure Laterality Date    CHOLECYSTECTOMY  2009   [facility]    Social History: Social History   Socioeconomic History   Marital status: Single    Spouse name: Not on file   Number of children: Not on file   Years of education: Not on file   Highest education level: Not on file  Occupational History   Not on file  Tobacco Use   Smoking status: Never   Smokeless tobacco: Never  Vaping Use   Vaping Use: Never used  Substance and Sexual Activity   Alcohol use: Yes    Comment: Rarely   Drug use: No   Sexual activity: Yes    Birth control/protection: I.U.D.  Other Topics Concern   Not on file  Social History Narrative   Not on file   Social Determinants of Health   Financial Resource Strain: Not on file  Food Insecurity: Not on file  Transportation Needs: Not on file  Physical Activity: Not on file  Stress: Not on file  Social Connections: Not on file  Intimate Partner Violence: Not on file    Family History: Family History  Problem Relation Age of Onset   Diabetes Maternal Grandmother    Migraines Maternal Grandmother    Healthy Father    Healthy Mother    Healthy Sister    Healthy Sister     Review of Systems: Review of Systems  Constitutional:  Negative for fever.   Physical Exam: Vital Signs There were no vitals taken for this visit.  Physical Exam  Constitutional:  General: Not in acute distress.    Appearance: Normal appearance. Not ill-appearing.  HENT:     Head: Normocephalic and atraumatic.  Eyes:     Pupils: Pupils are equal, round Neck:     Musculoskeletal: Normal range of motion.  Cardiovascular:     Rate and Rhythm: Normal rate    Pulses: Normal pulses.  Pulmonary:     Effort: Pulmonary effort is normal. No respiratory distress.  Abdominal:     General: Abdomen is flat. There is no distension.  Musculoskeletal: Normal range of motion.  No lower extremity swelling or edema.  No varicosities noted.  Peripheral pulses intact and symmetric. Skin:     General: Skin is warm and dry.     Findings: No erythema or rash.  Neurological:     General: No focal deficit present.     Mental Status: Alert and oriented to [place], place, and time. Mental status is at baseline.     Motor: No weakness.  Psychiatric:        Mood and Affect: Mood normal.        Behavior: Behavior normal.    Assessment/Plan: The patient is scheduled for breast reduction 03/24/2021 with Dr. Claudia Desanctis.  Risks, benefits, and alternatives of procedure discussed, questions answered and consent obtained.    Smoking Status: Non-smoker. Last Mammogram: None.  No family history of breast cancer.  Caprini Score: 3; Risk Factors include: BMI > 25 and length of planned surgery. Recommendation for mechanical prophylaxis. Encourage early ambulation.   Pictures obtained: At consult 10/26/2020  Post-op Rx sent to pharmacy: Norco, Zofran.  Patient was provided with the General Surgical Risk consent document and Pain Medication Agreement prior to their appointment.  They had adequate time to read through the risk consent documents and Pain Medication Agreement. We also discussed them in [place] together during this preop appointment. All of their questions were answered to their satisfaction.  Recommended calling if they have any further questions.  Risk consent form and Pain Medication Agreement to be scanned into patient's chart.  The risk that can be encountered with breast reduction were discussed and include the following but not limited to these:  Breast asymmetry, fluid accumulation, firmness of the breast, inability to breast feed, loss of nipple or areola, skin loss, decrease or no nipple sensation, fat necrosis of the breast tissue, bleeding, infection, healing delay.  There are risks of anesthesia, changes to skin sensation and injury to nerves or blood vessels.  The muscle can be temporarily or permanently injured.  You may have an allergic reaction to tape, suture, glue, blood products  which can result in skin discoloration, swelling, pain, skin lesions, poor healing.  Any of these can lead to the need for revisonal surgery or stage procedures.  A reduction has potential to interfere with diagnostic procedures.  Nipple or breast piercing can increase risks of infection.  This procedure is best done when the breast is fully developed.  Changes in the breast will continue to occur over time.  Pregnancy can alter the outcomes of previous breast reduction surgery, weight gain and weigh loss can also effect the long term appearance.   We discussed the possibility of amputation/free nipple graft technique due to the length of her STN.  She is understanding of the possibility that we would need to transition from a pedicle technique to a free nipple graft technique intraoperatively.  We discussed the risks associated with free nipple graft breast reductions, including but not limited to  failure of the graft, partial loss of the graft, loss of sensation of bilateral nipple areola, complete loss of the nipple areola graft, inability to breast-feed, postoperative wounds, ongoing wound care.  We also discussed the risks associated with the pedicle technique.  We discussed that with the pedicle technique she could develop nipple areolar necrosis which would result in loss of the nipple, this would also result in ongoing wound care and possible changes in the shape of her breast.     Electronically signed by: Krista Blue, PA-C 03/14/2021 4:17 PM

## 2021-03-14 ENCOUNTER — Ambulatory Visit (INDEPENDENT_AMBULATORY_CARE_PROVIDER_SITE_OTHER): Payer: 59 | Admitting: Physician Assistant

## 2021-03-14 ENCOUNTER — Other Ambulatory Visit: Payer: Self-pay

## 2021-03-14 DIAGNOSIS — N62 Hypertrophy of breast: Secondary | ICD-10-CM

## 2021-03-14 MED ORDER — ONDANSETRON 4 MG PO TBDP: 4.0000 mg | ORAL_TABLET | Freq: Three times a day (TID) | ORAL | 0 refills | Status: DC | PRN

## 2021-03-14 MED ORDER — HYDROCODONE-ACETAMINOPHEN 5-325 MG PO TABS
1.0000 | ORAL_TABLET | Freq: Four times a day (QID) | ORAL | 0 refills | Status: AC | PRN
Start: 2021-03-14 — End: 2021-03-19

## 2021-03-16 ENCOUNTER — Encounter (HOSPITAL_BASED_OUTPATIENT_CLINIC_OR_DEPARTMENT_OTHER): Payer: Self-pay | Admitting: Plastic Surgery

## 2021-03-16 ENCOUNTER — Other Ambulatory Visit: Payer: Self-pay

## 2021-03-23 NOTE — Anesthesia Preprocedure Evaluation (Addendum)
Anesthesia Evaluation  Patient identified by MRN, date of birth, ID band Patient awake    Reviewed: Allergy & Precautions, NPO status , Patient's Chart, lab work & pertinent test results  Airway Mallampati: II  TM Distance: >3 FB     Dental   Pulmonary neg pulmonary ROS,    breath sounds clear to auscultation       Cardiovascular negative cardio ROS   Rhythm:Regular Rate:Normal     Neuro/Psych negative neurological ROS     GI/Hepatic negative GI ROS, Neg liver ROS,   Endo/Other    Renal/GU negative Renal ROS     Musculoskeletal   Abdominal   Peds  Hematology   Anesthesia Other Findings   Reproductive/Obstetrics                             Anesthesia Physical Anesthesia Plan  ASA: 2  Anesthesia Plan: General   Post-op Pain Management:    Induction: Intravenous  PONV Risk Score and Plan: 3 and Ondansetron, Dexamethasone and Midazolam  Airway Management Planned:   Additional Equipment:   Intra-op Plan:   Post-operative Plan: Extubation in OR  Informed Consent: I have reviewed the patients History and Physical, chart, labs and discussed the procedure including the risks, benefits and alternatives for the proposed anesthesia with the patient or authorized representative who has indicated his/her understanding and acceptance.     Dental advisory given  Plan Discussed with: Anesthesiologist and CRNA  Anesthesia Plan Comments:        Anesthesia Quick Evaluation

## 2021-03-24 ENCOUNTER — Ambulatory Visit (HOSPITAL_BASED_OUTPATIENT_CLINIC_OR_DEPARTMENT_OTHER): Payer: 59 | Admitting: Anesthesiology

## 2021-03-24 ENCOUNTER — Encounter (HOSPITAL_BASED_OUTPATIENT_CLINIC_OR_DEPARTMENT_OTHER): Payer: Self-pay | Admitting: Plastic Surgery

## 2021-03-24 ENCOUNTER — Other Ambulatory Visit: Payer: Self-pay

## 2021-03-24 ENCOUNTER — Encounter (HOSPITAL_BASED_OUTPATIENT_CLINIC_OR_DEPARTMENT_OTHER)
Admission: RE | Disposition: A | Discharge status: Home / Self Care | Payer: Self-pay | Source: Home / Self Care | Attending: Plastic Surgery

## 2021-03-24 ENCOUNTER — Ambulatory Visit (HOSPITAL_BASED_OUTPATIENT_CLINIC_OR_DEPARTMENT_OTHER)
Admission: RE | Admit: 2021-03-24 | Discharge: 2021-03-24 | Disposition: A | Discharge status: Home / Self Care | Payer: 59 | Attending: Plastic Surgery | Admitting: Plastic Surgery

## 2021-03-24 DIAGNOSIS — Z833 Family history of diabetes mellitus: Secondary | ICD-10-CM | POA: Insufficient documentation

## 2021-03-24 DIAGNOSIS — N62 Hypertrophy of breast: Secondary | ICD-10-CM | POA: Insufficient documentation

## 2021-03-24 DIAGNOSIS — M4004 Postural kyphosis, thoracic region: Secondary | ICD-10-CM

## 2021-03-24 DIAGNOSIS — M546 Pain in thoracic spine: Secondary | ICD-10-CM

## 2021-03-24 DIAGNOSIS — M545 Low back pain, unspecified: Secondary | ICD-10-CM

## 2021-03-24 DIAGNOSIS — Z820 Family history of epilepsy and other diseases of the nervous system: Secondary | ICD-10-CM | POA: Insufficient documentation

## 2021-03-24 HISTORY — PX: BREAST REDUCTION SURGERY: SHX8

## 2021-03-24 LAB — POCT PREGNANCY, URINE: Preg Test, Ur: NEGATIVE

## 2021-03-24 SURGERY — MAMMOPLASTY, REDUCTION
Anesthesia: General | Site: Breast | Laterality: Bilateral

## 2021-03-24 MED ORDER — MIDAZOLAM HCL 5 MG/5ML IJ SOLN
INTRAMUSCULAR | Status: DC | PRN
  Administered 2021-03-24: 2 mg via INTRAVENOUS

## 2021-03-24 MED ORDER — TRANEXAMIC ACID-NACL 1000-0.7 MG/100ML-% IV SOLN
1000.0000 mg | INTRAVENOUS | Status: AC
  Administered 2021-03-24: 1000 mg via INTRAVENOUS

## 2021-03-24 MED ORDER — ROCURONIUM BROMIDE 100 MG/10ML IV SOLN
INTRAVENOUS | Status: DC | PRN
  Administered 2021-03-24: 50 mg via INTRAVENOUS
  Administered 2021-03-24: 10 mg via INTRAVENOUS

## 2021-03-24 MED ORDER — LACTATED RINGERS IV SOLN: INTRAVENOUS | Status: DC

## 2021-03-24 MED ORDER — HYDROMORPHONE HCL 1 MG/ML IJ SOLN
INTRAMUSCULAR | Status: AC
  Filled 2021-03-24: qty 1

## 2021-03-24 MED ORDER — CEFAZOLIN SODIUM-DEXTROSE 2-4 GM/100ML-% IV SOLN
INTRAVENOUS | Status: AC
  Filled 2021-03-24: qty 100

## 2021-03-24 MED ORDER — LIDOCAINE HCL (CARDIAC) PF 100 MG/5ML IV SOSY
PREFILLED_SYRINGE | INTRAVENOUS | Status: DC | PRN
  Administered 2021-03-24: 80 mg via INTRAVENOUS

## 2021-03-24 MED ORDER — SUFENTANIL CITRATE 50 MCG/ML IV SOLN
INTRAVENOUS | Status: AC
  Filled 2021-03-24: qty 1

## 2021-03-24 MED ORDER — ONDANSETRON HCL 4 MG/2ML IJ SOLN
INTRAMUSCULAR | Status: DC | PRN
  Administered 2021-03-24: 4 mg via INTRAVENOUS

## 2021-03-24 MED ORDER — PROPOFOL 500 MG/50ML IV EMUL
INTRAVENOUS | Status: AC
  Filled 2021-03-24: qty 50

## 2021-03-24 MED ORDER — PHENYLEPHRINE 40 MCG/ML (10ML) SYRINGE FOR IV PUSH (FOR BLOOD PRESSURE SUPPORT)
PREFILLED_SYRINGE | INTRAVENOUS | Status: AC
  Filled 2021-03-24: qty 10

## 2021-03-24 MED ORDER — HYDROCODONE-ACETAMINOPHEN 5-325 MG PO TABS
1.0000 | ORAL_TABLET | Freq: Once | ORAL | Status: AC | PRN
  Administered 2021-03-24: 1 via ORAL

## 2021-03-24 MED ORDER — ATROPINE SULFATE 0.4 MG/ML IV SOLN
INTRAVENOUS | Status: AC
  Filled 2021-03-24: qty 1

## 2021-03-24 MED ORDER — CEFAZOLIN SODIUM-DEXTROSE 2-4 GM/100ML-% IV SOLN
2.0000 g | INTRAVENOUS | Status: AC
  Administered 2021-03-24: 2 g via INTRAVENOUS

## 2021-03-24 MED ORDER — HYDROMORPHONE HCL 1 MG/ML IJ SOLN
INTRAMUSCULAR | Status: DC | PRN
  Administered 2021-03-24 (×2): .5 mg via INTRAVENOUS

## 2021-03-24 MED ORDER — TRANEXAMIC ACID-NACL 1000-0.7 MG/100ML-% IV SOLN
INTRAVENOUS | Status: AC
  Filled 2021-03-24: qty 100

## 2021-03-24 MED ORDER — ONDANSETRON HCL 4 MG/2ML IJ SOLN
INTRAMUSCULAR | Status: AC
  Filled 2021-03-24: qty 2

## 2021-03-24 MED ORDER — DIPHENHYDRAMINE HCL 50 MG/ML IJ SOLN
INTRAMUSCULAR | Status: DC | PRN
  Administered 2021-03-24: 6.25 mg via INTRAVENOUS

## 2021-03-24 MED ORDER — LACTATED RINGERS IV SOLN
INTRAVENOUS | Status: DC | PRN
  Administered 2021-03-24: 1800 mL

## 2021-03-24 MED ORDER — DEXAMETHASONE SODIUM PHOSPHATE 4 MG/ML IJ SOLN
INTRAMUSCULAR | Status: DC | PRN
  Administered 2021-03-24: 10 mg via INTRAVENOUS

## 2021-03-24 MED ORDER — LIDOCAINE 2% (20 MG/ML) 5 ML SYRINGE
INTRAMUSCULAR | Status: AC
  Filled 2021-03-24: qty 5

## 2021-03-24 MED ORDER — SUCCINYLCHOLINE CHLORIDE 200 MG/10ML IV SOSY
PREFILLED_SYRINGE | INTRAVENOUS | Status: DC | PRN
  Administered 2021-03-24: 140 mg via INTRAVENOUS

## 2021-03-24 MED ORDER — DEXAMETHASONE SODIUM PHOSPHATE 10 MG/ML IJ SOLN
INTRAMUSCULAR | Status: AC
  Filled 2021-03-24: qty 1

## 2021-03-24 MED ORDER — ROCURONIUM BROMIDE 10 MG/ML (PF) SYRINGE
PREFILLED_SYRINGE | INTRAVENOUS | Status: AC
  Filled 2021-03-24: qty 10

## 2021-03-24 MED ORDER — SUGAMMADEX SODIUM 200 MG/2ML IV SOLN
INTRAVENOUS | Status: DC | PRN
  Administered 2021-03-24: 200 mg via INTRAVENOUS

## 2021-03-24 MED ORDER — MIDAZOLAM HCL 2 MG/2ML IJ SOLN
INTRAMUSCULAR | Status: AC
  Filled 2021-03-24: qty 2

## 2021-03-24 MED ORDER — PROPOFOL 10 MG/ML IV BOLUS
INTRAVENOUS | Status: DC | PRN
  Administered 2021-03-24: 200 mg via INTRAVENOUS

## 2021-03-24 MED ORDER — HYDROCODONE-ACETAMINOPHEN 5-325 MG PO TABS
ORAL_TABLET | ORAL | Status: AC
  Filled 2021-03-24: qty 1

## 2021-03-24 MED ORDER — SUCCINYLCHOLINE CHLORIDE 200 MG/10ML IV SOSY
PREFILLED_SYRINGE | INTRAVENOUS | Status: AC
  Filled 2021-03-24: qty 10

## 2021-03-24 MED ORDER — DIPHENHYDRAMINE HCL 50 MG/ML IJ SOLN
INTRAMUSCULAR | Status: AC
  Filled 2021-03-24: qty 1

## 2021-03-24 MED ORDER — EPHEDRINE 5 MG/ML INJ
INTRAVENOUS | Status: AC
  Filled 2021-03-24: qty 5

## 2021-03-24 MED ORDER — FENTANYL CITRATE (PF) 100 MCG/2ML IJ SOLN: 25.0000 ug | INTRAMUSCULAR | Status: DC | PRN

## 2021-03-24 MED ORDER — SUFENTANIL CITRATE 50 MCG/ML IV SOLN
INTRAVENOUS | Status: DC | PRN
  Administered 2021-03-24 (×3): 10 ug via INTRAVENOUS

## 2021-03-24 SURGICAL SUPPLY — 53 items
APL PRP STRL LF DISP 70% ISPRP (MISCELLANEOUS) ×2
APL SKNCLS STERI-STRIP NONHPOA (GAUZE/BANDAGES/DRESSINGS) ×2
BENZOIN TINCTURE PRP APPL 2/3 (GAUZE/BANDAGES/DRESSINGS) ×4 IMPLANT
BLADE SURG 10 STRL SS (BLADE) ×6 IMPLANT
BNDG CMPR MED 10X6 ELC LF (GAUZE/BANDAGES/DRESSINGS) ×1
BNDG ELASTIC 6X10 VLCR STRL LF (GAUZE/BANDAGES/DRESSINGS) ×2 IMPLANT
CANISTER SUCT 1200ML W/VALVE (MISCELLANEOUS) ×2 IMPLANT
CHLORAPREP W/TINT 26 (MISCELLANEOUS) ×4 IMPLANT
COVER BACK TABLE 60X90IN (DRAPES) ×2 IMPLANT
COVER MAYO STAND STRL (DRAPES) ×2 IMPLANT
DRAIN PENROSE 1/2X12 LTX STRL (WOUND CARE) IMPLANT
DRAPE LAPAROSCOPIC ABDOMINAL (DRAPES) ×2 IMPLANT
DRAPE UTILITY XL STRL (DRAPES) ×2 IMPLANT
DRSG PAD ABDOMINAL 8X10 ST (GAUZE/BANDAGES/DRESSINGS) ×8 IMPLANT
ELECT REM PT RETURN 9FT ADLT (ELECTROSURGICAL) ×2
ELECTRODE REM PT RTRN 9FT ADLT (ELECTROSURGICAL) ×1 IMPLANT
GAUZE SPONGE 4X4 12PLY STRL (GAUZE/BANDAGES/DRESSINGS) ×4 IMPLANT
GLOVE SRG 8 PF TXTR STRL LF DI (GLOVE) ×3 IMPLANT
GLOVE SURG ENC MOIS LTX SZ7.5 (GLOVE) ×6 IMPLANT
GLOVE SURG ENC TEXT LTX SZ7.5 (GLOVE) ×2 IMPLANT
GLOVE SURG UNDER POLY LF SZ8 (GLOVE) ×6
GOWN STRL REUS W/ TWL LRG LVL3 (GOWN DISPOSABLE) ×4 IMPLANT
GOWN STRL REUS W/TWL LRG LVL3 (GOWN DISPOSABLE) ×8
MARKER SKIN DUAL TIP RULER LAB (MISCELLANEOUS) IMPLANT
NDL SAFETY ECLIPSE 18X1.5 (NEEDLE) IMPLANT
NEEDLE FILTER BLUNT 18X 1/2SAF (NEEDLE) ×1
NEEDLE FILTER BLUNT 18X1 1/2 (NEEDLE) ×1 IMPLANT
NEEDLE HYPO 18GX1.5 SHARP (NEEDLE)
NEEDLE HYPO 25X1 1.5 SAFETY (NEEDLE) IMPLANT
NEEDLE SPNL 18GX3.5 QUINCKE PK (NEEDLE) ×2 IMPLANT
NS IRRIG 1000ML POUR BTL (IV SOLUTION) ×2 IMPLANT
PACK BASIN DAY SURGERY FS (CUSTOM PROCEDURE TRAY) ×2 IMPLANT
PENCIL SMOKE EVACUATOR (MISCELLANEOUS) ×2 IMPLANT
SLEEVE SCD COMPRESS KNEE MED (STOCKING) ×2 IMPLANT
SPONGE T-LAP 18X18 ~~LOC~~+RFID (SPONGE) ×8 IMPLANT
STAPLER INSORB 30 2030 C-SECTI (MISCELLANEOUS) ×4 IMPLANT
STAPLER VISISTAT 35W (STAPLE) ×2 IMPLANT
STRIP SUTURE WOUND CLOSURE 1/2 (MISCELLANEOUS) ×8 IMPLANT
SUT MNCRL AB 4-0 PS2 18 (SUTURE) ×8 IMPLANT
SUT PDS 3-0 CT2 (SUTURE) ×6
SUT PDS II 3-0 CT2 27 ABS (SUTURE) ×3 IMPLANT
SUT VIC AB 3-0 PS1 18 (SUTURE)
SUT VIC AB 3-0 PS1 18XBRD (SUTURE) IMPLANT
SUT VLOC 180 P-14 24 (SUTURE) ×4 IMPLANT
SYR 50ML LL SCALE MARK (SYRINGE) IMPLANT
SYR BULB IRRIG 60ML STRL (SYRINGE) ×2 IMPLANT
SYR CONTROL 10ML LL (SYRINGE) IMPLANT
TAPE MEASURE VINYL STERILE (MISCELLANEOUS) IMPLANT
TOWEL GREEN STERILE FF (TOWEL DISPOSABLE) ×4 IMPLANT
TUBE CONNECTING 20X1/4 (TUBING) ×2 IMPLANT
TUBING INFILTRATION IT-10001 (TUBING) ×2 IMPLANT
UNDERPAD 30X36 HEAVY ABSORB (UNDERPADS AND DIAPERS) ×4 IMPLANT
YANKAUER SUCT BULB TIP NO VENT (SUCTIONS) ×2 IMPLANT

## 2021-03-24 NOTE — Op Note (Signed)
Operative Note   DATE OF OPERATION: 03/24/2021  LOCATION: [facility]   SURGICAL DEPARTMENT: Plastic Surgery  PREOPERATIVE DIAGNOSES: Bilateral symptomatic macromastia.  POSTOPERATIVE DIAGNOSES:  same  PROCEDURE: Bilateral breast reduction with superomedial pedicle.  SURGEON: Ancil Linsey, MD  ASSISTANT: None  ANESTHESIA: General.  COMPLICATIONS: None.   INDICATIONS FOR PROCEDURE:  The patient, Monique Luna is a 31 y.o. female born on August 22, 1990, is here for treatment of bilateral symptomatic macromastia. MRN: [id number]  CONSENT:  Informed consent was obtained directly from the patient. Risks, benefits and alternatives were fully discussed. Specific risks including but not limited to bleeding, infection, hematoma, seroma, scarring, pain, infection, contracture, asymmetry, wound healing problems, and need for further surgery were all discussed. The patient did have an ample opportunity to have questions answered to satisfaction.   DESCRIPTION OF PROCEDURE:  The patient was marked preoperatively for a Wise pattern skin excision.  The patient was taken to the operating room. SCDs were placed and antibiotics were given. General anesthesia was administered.The patient's operative site was prepped and draped in a sterile fashion. A time out was performed and all information was confirmed to be correct.  Right Breast: The breast was infiltrated with tumescent solution to help with hemostasis.  The nipple was marked with a cookie cutter.  A superomedial pedicle was drawn out with the base of at least 8 cm in size.  A breast tourniquet was then applied and the pedicle was de-epithelialized.  Breast tourniquet was then let down and all incisions were made with a 10 blade.  The pedicle was then isolated down to the chest wall with cautery and the excision was performed removing tissue primarily inferiorly and laterally.  Hemostasis was obtained and the wound was stapled  closed.  Left breast:  The breast was infiltrated with tumescent solution to help with hemostasis.  The nipple was marked with a cookie cutter.  A superomedial pedicle was drawn out with the base of at least 8 cm in size.  A breast tourniquet was then applied and the pedicle was de-epithelialized.  Breast tourniquet was then let down and all incisions were made with a 10 blade.  The pedicle was then isolated down to the chest wall with cautery and the excision was performed removing tissue primarily inferiorly and laterally.  Hemostasis was obtained and the wound was stapled closed.  Patient was then set up to check for size and symmetry.  Minor modifications were made.  This resulted in a total of 1849g removed from the right side and 1623g removed from the left side.  The inframammary incision was closed with a combination of buried in-sorb staples and a running 3-0 Quill suture.  The vertical and periareolar limbs were closed with interrupted buried 4-0 Monocryl and a running 4-0 Quill suture.  Steri-Strips were then applied along with a soft dressing and Ace wrap.  The patient tolerated the procedure well.  There were no complications. The patient was allowed to wake from anesthesia, extubated and taken to the recovery room in satisfactory condition.  I was present for the entire procedure.

## 2021-03-24 NOTE — Interval H&P Note (Signed)
Patient seen and examined. Risks and benefits discussed. Proceed with surgery.

## 2021-03-24 NOTE — Transfer of Care (Signed)
Immediate Anesthesia Transfer of Care Note  Patient: Monique Luna  Procedure(s) Performed: MAMMARY REDUCTION  (BREAST) (Bilateral: Breast)  Patient Location: PACU  Anesthesia Type:General  Level of Consciousness: awake, alert , oriented, drowsy and patient cooperative  Airway & Oxygen Therapy: Patient Spontanous Breathing and Patient connected to face mask oxygen  Post-op Assessment: Report given to RN and Post -op Vital signs reviewed and stable  Post vital signs: Reviewed and stable  Last Vitals:  Vitals Value Taken Time  BP    Temp    Pulse    Resp    SpO2      Last Pain:  Vitals:   03/24/21 0636  TempSrc: Oral  PainSc: 0-No pain      Patients Stated Pain Goal: 8 (03/24/21 0636)  Complications: No notable events documented.

## 2021-03-24 NOTE — Anesthesia Postprocedure Evaluation (Signed)
Anesthesia Post Note  Patient: Monique Luna  Procedure(s) Performed: MAMMARY REDUCTION  (BREAST) (Bilateral: Breast)     Patient location during evaluation: PACU Anesthesia Type: General Level of consciousness: awake Pain management: pain level controlled Vital Signs Assessment: post-procedure vital signs reviewed and stable Respiratory status: spontaneous breathing Cardiovascular status: stable Postop Assessment: no apparent nausea or vomiting Anesthetic complications: no   No notable events documented.  Last Vitals:  Vitals:   03/24/21 1002 03/24/21 1015  BP: 124/77 127/77  Pulse: 97 94  Resp: 18 17  Temp: 36.6 C   SpO2: 100% 96%    Last Pain:  Vitals:   03/24/21 1002  TempSrc:   PainSc: 0-No pain                 Jaquel Glassburn

## 2021-03-24 NOTE — Discharge Instructions (Addendum)
Activity As tolerated: NO showers for 3 days No heavy activities  Diet: Regular  Wound Care: Keep dressing clean & dry for 3 days.  Keep wrap applied with compression as much as possible.    Do not change dressings for 3 days unless soiled.  Can change if needed but make sure to reapply wrap. After three days can remove wrap and shower.  Then reapply dressings if needed and continue compression with wrap or soft sports bra. Call doctor if any unusual problems occur such as pain, excessive bleeding, unrelieved nausea/vomiting, fever &/or chills  Follow-up appointment: Scheduled for next week.   Post Anesthesia Home Care Instructions  Activity: Get plenty of rest for the remainder of the day. A responsible individual must stay with you for 24 hours following the procedure.  For the next 24 hours, DO NOT: -Drive a car -Operate machinery -Drink alcoholic beverages -Take any medication unless instructed by your physician -Make any legal decisions or sign important papers.  Meals: Start with liquid foods such as gelatin or soup. Progress to regular foods as tolerated. Avoid greasy, spicy, heavy foods. If nausea and/or vomiting occur, drink only clear liquids until the nausea and/or vomiting subsides. Call your physician if vomiting continues.  Special Instructions/Symptoms: Your throat may feel dry or sore from the anesthesia or the breathing tube placed in your throat during surgery. If this causes discomfort, gargle with warm salt water. The discomfort should disappear within 24 hours.  If you had a scopolamine patch placed behind your ear for the management of post- operative nausea and/or vomiting:  1. The medication in the patch is effective for 72 hours, after which it should be removed.  Wrap patch in a tissue and discard in the trash. Wash hands thoroughly with soap and water. 2. You may remove the patch earlier than 72 hours if you experience unpleasant side effects which may  include dry mouth, dizziness or visual disturbances. 3. Avoid touching the patch. Wash your hands with soap and water after contact with the patch.     

## 2021-03-24 NOTE — Anesthesia Procedure Notes (Addendum)
Procedure Name: Intubation Date/Time: 03/24/2021 7:41 AM Performed by: Willa Frater, CRNA Pre-anesthesia Checklist: Patient identified, Emergency Drugs available, Suction available and Patient being monitored Patient Re-evaluated:Patient Re-evaluated prior to induction Oxygen Delivery Method: Circle system utilized Preoxygenation: Pre-oxygenation with 100% oxygen Induction Type: IV induction Ventilation: Mask ventilation without difficulty Laryngoscope Size: Mac and 3 Grade View: Grade I Tube type: Oral Number of attempts: 1 Airway Equipment and Method: Stylet and Oral airway Placement Confirmation: ETT inserted through vocal cords under direct vision, positive ETCO2 and breath sounds checked- equal and bilateral Secured at: 22 cm Tube secured with: Tape Dental Injury: Teeth and Oropharynx as per pre-operative assessment

## 2021-03-24 NOTE — Brief Op Note (Signed)
03/24/2021  9:50 AM  PATIENT:  Monique Luna  30 y.o. female  PRE-OPERATIVE DIAGNOSIS:  macromastia  POST-OPERATIVE DIAGNOSIS:  macromastia  PROCEDURE:  Procedure(s): MAMMARY REDUCTION  (BREAST) (Bilateral)  SURGEON:  Surgeon(s) and Role:    * Rakayla Ricklefs, Wendy Poet, MD - Primary  PHYSICIAN ASSISTANT: None  ASSISTANTS: none   ANESTHESIA:   general  EBL:  30 mL   BLOOD ADMINISTERED:none  DRAINS: none   LOCAL MEDICATIONS USED:  MARCAINE     SPECIMEN:  Source of Specimen:  r and l breast tissue  DISPOSITION OF SPECIMEN:  PATHOLOGY  COUNTS:  YES  TOURNIQUET:  * No tourniquets in log *  DICTATION: .Dragon Dictation  PLAN OF CARE: Discharge to home after PACU  PATIENT DISPOSITION:  PACU - hemodynamically stable.   Delay start of Pharmacological VTE agent (>24hrs) due to surgical blood loss or risk of bleeding: not applicable

## 2021-03-27 ENCOUNTER — Telehealth: Payer: Self-pay | Admitting: Plastic Surgery

## 2021-03-27 LAB — SURGICAL PATHOLOGY

## 2021-03-27 NOTE — Telephone Encounter (Signed)
Patient called and mentioned they had surgery on Friday and had a few Post Op Care Questions.

## 2021-03-27 NOTE — Telephone Encounter (Signed)
Patient is inquiring about instructions for aftercare and showering. Sx 10/14. Please call to advise [phone]. Thank you.

## 2021-03-27 NOTE — Telephone Encounter (Signed)
Pt asked if she could get guaze wet that is taped to her still after removing the ACE wrap. I adv her that she could but do not allow dressings to get direct contact with the shower but stand with her back facing the shower and allowing the water to fall down across her shoulders onto her breasts. Adv pt to not remove those bandages. Pt had SX on 10/14 by Dr. Arita Miss. Also adv pt that she could transition to a sports bra if she wanted to. Pt conveyed understanding of all instructions.

## 2021-03-27 NOTE — Telephone Encounter (Signed)
Please see other telephone note from 10/17.

## 2021-03-28 ENCOUNTER — Encounter (HOSPITAL_BASED_OUTPATIENT_CLINIC_OR_DEPARTMENT_OTHER): Payer: Self-pay | Admitting: Plastic Surgery

## 2021-03-30 ENCOUNTER — Ambulatory Visit (INDEPENDENT_AMBULATORY_CARE_PROVIDER_SITE_OTHER): Payer: 59 | Admitting: Plastic Surgery

## 2021-03-30 ENCOUNTER — Other Ambulatory Visit: Payer: Self-pay

## 2021-03-30 DIAGNOSIS — N62 Hypertrophy of breast: Secondary | ICD-10-CM

## 2021-03-30 NOTE — Progress Notes (Signed)
Patient presents about 1 week out from bilateral breast reduction.  She feels good and is happy.  On exam everything looks to be healing fine with intact incisions and viable nipple areolar complexes.  There is no subcutaneous fluid.  I have asked her to continue compressive garments and avoid strenuous activity.  We will see her again in a few weeks.  

## 2021-04-03 NOTE — Telephone Encounter (Signed)
No follow up call needed at this time

## 2021-04-07 ENCOUNTER — Telehealth: Payer: Self-pay | Admitting: Plastic Surgery

## 2021-04-07 NOTE — Telephone Encounter (Signed)
Per patient, there is light brown drainage coming from both nipples with a bad smell. Sx 10/14. Please call to advise [phone]. Thank you.

## 2021-04-07 NOTE — Telephone Encounter (Signed)
Pt reported that she has light brown drainage from both breasts, no pain, no redness, fever, n/v and has been placing gauze over the draining areas. She reports that her steri strips are still intact and the drainage is coming out from underneath. Pt also reports it has a foul odor.   Per Maud, [place], advise pt to apply vaseline and gauze, drainage and an odor at times is normal but we will assess her at her next appt, Thursday, 11/3 at 1 pm with Dr. Arita Miss.   Adv pt to call office if Sx worsen over the weekend and the call will be forwarded to the on call provider. Pt conveyed understanding.

## 2021-04-13 ENCOUNTER — Ambulatory Visit (INDEPENDENT_AMBULATORY_CARE_PROVIDER_SITE_OTHER): Payer: 59 | Admitting: Plastic Surgery

## 2021-04-13 ENCOUNTER — Other Ambulatory Visit: Payer: Self-pay

## 2021-04-13 DIAGNOSIS — N62 Hypertrophy of breast: Secondary | ICD-10-CM

## 2021-04-13 NOTE — Progress Notes (Signed)
Patient is here about 3 weeks postop from bilateral breast reduction.  She is overall happy.  She points out some small wounds around her nipple areolar complex.  On exam she has a good symmetric result.  I believe she still a bit swollen.  There is some small wounds around the periphery of the areola but these are very small and right along the incision line.  No spitting sutures that I could tell.  Overall things look to be doing well.  I remove the rest of the Steri-Strips.  We will plan to see her again in 3 weeks.  All of her questions were answered.

## 2021-04-26 ENCOUNTER — Telehealth: Payer: Self-pay | Admitting: *Deleted

## 2021-04-26 NOTE — Telephone Encounter (Signed)
Received form from [facility] requesting to be filled out before patient has cleaning done.    Form given to Dr. Arita Miss to complete.  Form completed and faxed back to [facility].  Confirmation received and copy scanned into the chart.//AB/CMA

## 2021-04-27 ENCOUNTER — Ambulatory Visit (INDEPENDENT_AMBULATORY_CARE_PROVIDER_SITE_OTHER): Payer: 59 | Admitting: Surgical

## 2021-04-27 ENCOUNTER — Other Ambulatory Visit: Payer: Self-pay

## 2021-04-27 DIAGNOSIS — M545 Low back pain, unspecified: Secondary | ICD-10-CM

## 2021-04-27 DIAGNOSIS — N62 Hypertrophy of breast: Secondary | ICD-10-CM

## 2021-04-27 DIAGNOSIS — M4004 Postural kyphosis, thoracic region: Secondary | ICD-10-CM

## 2021-04-27 DIAGNOSIS — M546 Pain in thoracic spine: Secondary | ICD-10-CM

## 2021-04-27 NOTE — Progress Notes (Signed)
Patient is a 30 year old female status post bilateral breast reduction with Dr.  Arita Miss on 03/24/2021.  Patient is 4 weeks postop. Patient reports some burning sensation and tenderness along the right lateral breast.  She also has some questions about nipple areola sensitivity and nipple projection.  Chaperone present on exam On exam bilateral NAC's are viable, bilateral breast incisions are intact.  She does have some small wounds on the bilateral NAC's that are healing well, there has been some pigmentation loss due to the wound but this is beginning to return.  There is no erythema or cellulitic changes noted.  She does have some bilateral breast swelling, I do not appreciate any subcutaneous fluid collections with palpation.  I do not see any wounds along the inframammary fold or vertical limb incisions.   We discussed that sensitivity may return over the next 3 to 6 months as well as projection of the nipple may return over the next 3 to 6 months, however it is different for every patient and time may vary.  Patient was understanding of this.  All of her questions were answered. Recommend continuing with compressive garment 24/7 until 6 weeks post-op Recommend avoiding strenuous activity/heavy lifting until 6 weeks post-op Recommend following up as needed, patient is agreement with this plan. Recommend calling with any questions or concerns. Pictures taken placed in the patient's chart with patient's permission.

## 2021-06-08 DIAGNOSIS — Z0000 Encounter for general adult medical examination without abnormal findings: Secondary | ICD-10-CM | POA: Diagnosis not present

## 2021-06-08 DIAGNOSIS — Z3200 Encounter for pregnancy test, result unknown: Secondary | ICD-10-CM | POA: Diagnosis not present

## 2021-06-08 DIAGNOSIS — Z1389 Encounter for screening for other disorder: Secondary | ICD-10-CM | POA: Diagnosis not present

## 2021-06-08 DIAGNOSIS — B3731 Acute candidiasis of vulva and vagina: Secondary | ICD-10-CM | POA: Diagnosis not present

## 2021-06-08 DIAGNOSIS — D509 Iron deficiency anemia, unspecified: Secondary | ICD-10-CM | POA: Diagnosis not present

## 2021-07-17 DIAGNOSIS — Z6830 Body mass index (BMI) 30.0-30.9, adult: Secondary | ICD-10-CM | POA: Diagnosis not present

## 2021-08-29 DIAGNOSIS — H52222 Regular astigmatism, left eye: Secondary | ICD-10-CM | POA: Diagnosis not present

## 2021-08-29 DIAGNOSIS — H5213 Myopia, bilateral: Secondary | ICD-10-CM | POA: Diagnosis not present

## 2021-09-25 DIAGNOSIS — Z6830 Body mass index (BMI) 30.0-30.9, adult: Secondary | ICD-10-CM | POA: Diagnosis not present

## 2021-09-25 DIAGNOSIS — Z0000 Encounter for general adult medical examination without abnormal findings: Secondary | ICD-10-CM | POA: Diagnosis not present

## 2021-09-25 DIAGNOSIS — R000 Tachycardia, unspecified: Secondary | ICD-10-CM | POA: Diagnosis not present

## 2021-09-25 DIAGNOSIS — D509 Iron deficiency anemia, unspecified: Secondary | ICD-10-CM | POA: Diagnosis not present

## 2021-10-19 ENCOUNTER — Encounter: Payer: Self-pay | Admitting: Cardiology

## 2021-10-19 ENCOUNTER — Ambulatory Visit (INDEPENDENT_AMBULATORY_CARE_PROVIDER_SITE_OTHER): Payer: 59 | Admitting: Cardiology

## 2021-10-19 VITALS — BP 146/84 | HR 91 | Ht 65.0 in | Wt 187.0 lb

## 2021-10-19 DIAGNOSIS — R011 Cardiac murmur, unspecified: Secondary | ICD-10-CM | POA: Diagnosis not present

## 2021-10-19 DIAGNOSIS — R002 Palpitations: Secondary | ICD-10-CM

## 2021-10-19 NOTE — Patient Instructions (Signed)
Medication Instructions:  ?- Your physician recommends that you continue on your current medications as directed. Please refer to the Current Medication list given to you today. ? ?*If you need a refill on your cardiac medications before your next appointment, please call your pharmacy* ? ? ?Lab Work: ?- none ordered ? ?If you have labs (blood work) drawn today and your tests are completely normal, you will receive your results only by: ?MyChart Message (if you have MyChart) OR ?A paper copy in the mail ?If you have any lab test that is abnormal or we need to change your treatment, we will call you to review the results. ? ? ?Testing/Procedures: ? ?1) Echocardiogram: ?- Your physician has requested that you have an echocardiogram. Echocardiography is a painless test that uses sound waves to create images of your heart. It provides your doctor with information about the size and shape of your heart and how well your heart?s chambers and valves are working. This procedure takes approximately one hour. There are no restrictions for this procedure. There is a possibility that an IV may need to be started during your test to inject an image enhancing agent. This is done to obtain more optimal pictures of your heart. Therefore we ask that you do at least drink some water prior to coming in to hydrate your veins.  ? ? ? ?Follow-Up: ?At [facility], you and your health needs are our priority.  As part of our continuing mission to provide you with exceptional heart care, we have created designated Provider Care Teams.  These Care Teams include your primary Cardiologist (physician) and Advanced Practice Providers (APPs -  Physician Assistants and Nurse Practitioners) who all work together to provide you with the care you need, when you need it. ? ?We recommend signing up for the patient portal called "MyChart".  Sign up information is provided on this After Visit Summary.  MyChart is used to connect with patients for  Virtual Visits (Telemedicine).  Patients are able to view lab/test results, encounter notes, upcoming appointments, etc.  Non-urgent messages can be sent to your provider as well.   ?To learn more about what you can do with MyChart, go to [email or web address].   ? ?Your next appointment:   ?Late July/ early August  ? ?The format for your next appointment:   ?In [place] ? ?Provider:   ?Bryan Lemma, MD  ? ? ?Other Instructions ? ?Echocardiogram ?An echocardiogram is a test that uses sound waves (ultrasound) to produce images of the heart. ?Images from an echocardiogram can provide important information about: ?Heart size and shape. ?The size and thickness and movement of your heart's walls. ?Heart muscle function and strength. ?Heart valve function or if you have stenosis. Stenosis is when the heart valves are too narrow. ?If blood is flowing backward through the heart valves (regurgitation). ?A tumor or infectious growth around the heart valves. ?Areas of heart muscle that are not working well because of poor blood flow or injury from a heart attack. ?Aneurysm detection. An aneurysm is a weak or damaged part of an artery wall. The wall bulges out from the normal force of blood pumping through the body. ?Tell a health care provider about: ?Any allergies you have. ?All medicines you are taking, including vitamins, herbs, eye drops, creams, and over-the-counter medicines. ?Any blood disorders you have. ?Any surgeries you have had. ?Any medical conditions you have. ?Whether you are pregnant or may be pregnant. ?What are the risks? ?Generally, this is a safe  test. However, problems may occur, including an allergic reaction to dye (contrast) that may be used during the test. ?What happens before the test? ?No specific preparation is needed. You may eat and drink normally. ?What happens during the test? ? ?You will take off your clothes from the waist up and put on a hospital gown. ?Electrodes or electrocardiogram  (ECG)patches may be placed on your chest. The electrodes or patches are then connected to a device that monitors your heart rate and rhythm. ?You will lie down on a table for an ultrasound exam. A gel will be applied to your chest to help sound waves pass through your skin. ?A handheld device, called a transducer, will be pressed against your chest and moved over your heart. The transducer produces sound waves that travel to your heart and bounce back (or "echo" back) to the transducer. These sound waves will be captured in real-time and changed into images of your heart that can be viewed on a video monitor. The images will be recorded on a computer and reviewed by your health care provider. ?You may be asked to change positions or hold your breath for a short time. This makes it easier to get different views or better views of your heart. ?In some cases, you may receive contrast through an IV in one of your veins. This can improve the quality of the pictures from your heart. ?The procedure may vary among health care providers and hospitals. ?What can I expect after the test? ?You may return to your normal, everyday life, including diet, activities, and medicines, unless your health care provider tells you not to do that. ?Follow these instructions at home: ?It is up to you to get the results of your test. Ask your health care provider, or the department that is doing the test, when your results will be ready. ?Keep all follow-up visits. This is important. ?Summary ?An echocardiogram is a test that uses sound waves (ultrasound) to produce images of the heart. ?Images from an echocardiogram can provide important information about the size and shape of your heart, heart muscle function, heart valve function, and other possible heart problems. ?You do not need to do anything to prepare before this test. You may eat and drink normally. ?After the echocardiogram is completed, you may return to your normal, everyday  life, unless your health care provider tells you not to do that. ?This information is not intended to replace advice given to you by your health care provider. Make sure you discuss any questions you have with your health care provider. ?Document Revised: 02/08/2021 Document Reviewed: 01/19/2020 ?Elsevier Patient Education ? 2023 Elsevier Inc. ? ? ?Important Information About Sugar ? ? ? ? ? ? ?

## 2021-10-19 NOTE — Progress Notes (Signed)
? ? ?Primary Care Provider: Default, Provider, MD ?[facility] Cardiologist: None ?Electrophysiologist: None ? ?Clinic Note: ?Chief Complaint  ?Patient presents with  ? NEW patient-Patient reports elevated HR readings  ?  Readings from her Apple watch have been between 108-126 and last >10 minutes. Denies chest pain/SOB/palpitations.   ? ?===================================  ? ?ASSESSMENT/PLAN  ? ?Problem List Items Addressed This Visit   ? ?  ? Cardiology Problems  ? Systolic click-murmur syndrome - Primary  ?  Difficult exam, I cannot exclude a click and soft systolic murmur, initially heard with diaphragm, but not with the bell. ? ?With her presentation of a periodic tachycardia, need to exclude structural abnormality such as mitral prolapse which could be correlated to tachypalpitations. ? ?Plan: Check 2D echo ? ?  ?  ? Relevant Orders  ? EKG 12-Lead  ? ECHOCARDIOGRAM COMPLETE  ?  ? Other  ? Rapid palpitations  ?  Unfortunately she is not having enough of the stress tolerates well episodes for me to capture anything on monitor.  I think her wearing an event monitor would simply prove to be useless as we would be unlikely to capture anything. ? ?She does have 5th generation Apple watch which does have the ability to get rhythm strip evaluation.  She has not ? ?Had to do yet but functionality is there.  I think this is probably a great way for her to potentially capture a spell using the rhythm strip function would recommend you follow-up. ?My suspicion, is that her tachycardia is potentially related to anemia.  We did discuss importance of adequate hydration and adequate nutrition.  Hoping to drink 70 to 80 ounces of water a day. ? ?We also discussed vagal maneuvers including: Vigorous cough, Valsalva, ice cold water, squatting. ? ?We will reassess in 3 months. ? ?  ?  ? ? ?=================================== ? ?HPI:   ? ?Monique Luna is a 31 y.o. female with iron deficiency anemia-recently started on  FeSO4 supplementation, who is being seen today for the evaluation of RAPID HEART RATE SPELLS at the request of Monique Crook* -[facility] ? ?Monique Luna was recently seen at the [facility] for follow-up evaluation of her anemia.  She also apparently had been placed on Victoza for obesity..  She indicated that she stopped taking it earlier this month because of concerns that it was giving her fast heart rate spells.  (However on recollection, she actually started noticing first episode of fast heart rates 2 weeks before starting Victoza.  When she mentioned the fast heart rate spells her PCP, she was her for cardiology evaluation. ? ?Recent Hospitalizations:  ?03/24/2021: Mammary reduction surgery ? ?Reviewed  CV studies:   ? ?The following studies were reviewed today: (if available, images/films reviewed: From Epic Chart or Care Everywhere) ?None: ? ?Interval History:  ? ?Monique Luna presents here today for evaluation of episodic fast heart rates.  She said the first episode was in early December 2022 she was noticing heart rates going up intermittently into the 110 to 130 bpm range for short periods of time.  She says she has had 9 spells since December, but the first episode that occurred this calendar year is what made her feel somewhat overwhelmed and jittery.  She has felt somewhat off.  Otherwise she has not had any lightheadedness dizziness or wooziness.  No syncope or near syncope symptoms.  Just feels unusual fast heart rate. ? ?The episodes last may be about 10-20  minutes it would be symptoms of that one episode that made her feel funny which lasted maybe 30 minutes.  There is never been any episodes lasting more than 30 minutes or up to an hour.  She has denied any dizziness or wooziness or lightheadedness except that one episode.  Usually, the spells happen when she is at rest and during activity.  Not associated any chest pain or  pressure, and no syncope or near syncope.  She says that they are relatively regular in nature, not irregular. ? ?Otherwise, correcting when she is completely stable, denying any chest pain or pressure with rest or exertion.  No PND orthopnea edema.  No claudication ? ? ?REVIEWED OF SYSTEMS  ? ?Review of Systems  ?Constitutional:  Negative for malaise/fatigue and weight loss.  ?HENT:  Negative for congestion and nosebleeds.   ?Respiratory: Negative.    ?Cardiovascular:  Negative for claudication and leg swelling.  ?Gastrointestinal:  Negative for abdominal pain, blood in stool, constipation and melena.  ?Genitourinary:  Negative for hematuria.  ?     She denies heavy periods.  ?Musculoskeletal:  Negative for back pain and joint pain.  ?Neurological:  Negative for dizziness, focal weakness, weakness and headaches.  ?Psychiatric/Behavioral:  Negative for depression and memory loss. The patient is nervous/anxious. The patient does not have insomnia.   ? ?I have reviewed and (if needed) personally updated the patient's problem list, medications, allergies, past medical and surgical history, social and family history.  ? ?PAST MEDICAL HISTORY  ? ?Past Medical History:  ?Diagnosis Date  ? Urethral diverticulum   ? ? ?PAST SURGICAL HISTORY  ? ?Past Surgical History:  ?Procedure Laterality Date  ? BREAST REDUCTION SURGERY Bilateral 03/24/2021  ? Procedure: MAMMARY REDUCTION  (BREAST);  Surgeon: Cindra Presume, MD;  Location: [place];  Service: Plastics;  Laterality: Bilateral;  ? CHOLECYSTECTOMY  2009  ? [place]  ? ? ? ?There is no immunization history on file for this patient. ? ?MEDICATIONS/ALLERGIES  ? ?Current Meds  ?Medication Sig  ? ferrous sulfate 325 (65 FE) MG tablet Take 325 mg by mouth every other day.  ? ? ?No Known Allergies ? ?SOCIAL HISTORY/FAMILY HISTORY  ? ?Reviewed in Epic:  ? ?Social History  ? ?Tobacco Use  ? Smoking status: Never  ? Smokeless tobacco: Never  ?Vaping Use  ?  Vaping Use: Never used  ?Substance Use Topics  ? Alcohol use: Yes  ?  Comment: Rarely  ? Drug use: No  ? ?Social History  ? ?Social History Narrative  ? Not on file  ? ?Family History  ?Problem Relation Age of Onset  ? Hypertension Mother   ? Hypertension Father   ? Healthy Sister   ? Healthy Sister   ? Diabetes Maternal Grandmother   ? Migraines Maternal Grandmother   ? ? ?OBJCTIVE -PE, EKG, labs  ? ?Wt Readings from Last 3 Encounters:  ?10/19/21 187 lb (84.8 kg)  ?03/24/21 190 lb 11.2 oz (86.5 kg)  ?10/26/20 190 lb (86.2 kg)  ? ? ?Physical Exam: ?BP (!) 146/84 (BP Location: Right Arm, Patient Position: Sitting, Cuff Size: Large)   Pulse 91   Ht 5\' 5"  (1.651 m)   Wt 187 lb (84.8 kg)   SpO2 99%   BMI 31.12 kg/m?  ?Physical Exam ?Vitals reviewed.  ?Constitutional:   ?   General: She is not in acute distress. ?   Appearance: Normal appearance. She is obese. She is not ill-appearing or toxic-appearing.  ?  HENT:  ?   Head: Normocephalic and atraumatic.  ?Eyes:  ?   Extraocular Movements: Extraocular movements intact.  ?   Conjunctiva/sclera: Conjunctivae normal.  ?   Pupils: Pupils are equal, round, and reactive to light.  ?Neck:  ?   Vascular: No carotid bruit or JVD.  ?Cardiovascular:  ?   Rate and Rhythm: Normal rate and regular rhythm. No extrasystoles are present. ?   Chest Wall: PMI is not displaced.  ?   Pulses: Normal pulses.  ?   Heart sounds: S2 normal. A midsystolic click (Cannot exclude soft click). Murmur (Cannot exclude systolic murmur heard at the left lower sternal border.) heard.  ?  No friction rub. No gallop.  ?Musculoskeletal:  ?   Cervical back: Normal range of motion.  ?Neurological:  ?   Mental Status: She is alert.  ? ? ? Adult ECG Report ? Rate: 91;  Rhythm: normal sinus rhythm and nonspecific ST-T wave changes. ;  ? Narrative Interpretation: Normal axis, intervals durations. ? ?Recent Labs: No labs ?No results found for: CHOL, HDL, LDLCALC, LDLDIRECT, TRIG, CHOLHDL ?No results found for:  CREATININE, BUN, NA, K, CL, CO2 ?   ? View : No data to display.  ?  ?  ?  ? ? ?No results found for: HGBA1C ?No results found for: TSH ? ?================================================== ? ?COVID-19 Education: ?The sign

## 2021-10-20 ENCOUNTER — Encounter: Payer: Self-pay | Admitting: Cardiology

## 2021-10-20 DIAGNOSIS — R011 Cardiac murmur, unspecified: Secondary | ICD-10-CM | POA: Insufficient documentation

## 2021-10-20 DIAGNOSIS — R002 Palpitations: Secondary | ICD-10-CM | POA: Insufficient documentation

## 2021-10-20 NOTE — Assessment & Plan Note (Signed)
Difficult exam, I cannot exclude a click and soft systolic murmur, initially heard with diaphragm, but not with the bell. ? ?With her presentation of a periodic tachycardia, need to exclude structural abnormality such as mitral prolapse which could be correlated to tachypalpitations. ? ?Plan: Check 2D echo ?

## 2021-10-20 NOTE — Assessment & Plan Note (Addendum)
Unfortunately she is not having enough of the stress tolerates well episodes for me to capture anything on monitor.  I think her wearing an event monitor would simply prove to be useless as we would be unlikely to capture anything. ? ?She does have 5th generation Apple watch which does have the ability to get rhythm strip evaluation.  She has not ? ?Had to do yet but functionality is there.  I think this is probably a great way for her to potentially capture a spell using the rhythm strip function would recommend you follow-up. ?My suspicion, is that her tachycardia is potentially related to anemia.  We did discuss importance of adequate hydration and adequate nutrition.  Hoping to drink 70 to 80 ounces of water a day. ? ?We also discussed vagal maneuvers including: Vigorous cough, Valsalva, ice cold water, squatting. ? ?We will reassess in 3 months. ?

## 2021-11-08 DIAGNOSIS — L658 Other specified nonscarring hair loss: Secondary | ICD-10-CM | POA: Diagnosis not present

## 2021-11-21 ENCOUNTER — Ambulatory Visit (INDEPENDENT_AMBULATORY_CARE_PROVIDER_SITE_OTHER): Payer: 59

## 2021-11-21 DIAGNOSIS — R011 Cardiac murmur, unspecified: Secondary | ICD-10-CM | POA: Diagnosis not present

## 2021-11-21 HISTORY — PX: TRANSTHORACIC ECHOCARDIOGRAM: SHX275

## 2021-11-21 LAB — ECHOCARDIOGRAM COMPLETE
AR max vel: 1.36 cm2
AV Area VTI: 1.48 cm2
AV Area mean vel: 1.37 cm2
AV Mean grad: 5 mmHg
AV Peak grad: 8.4 mmHg
Ao pk vel: 1.45 m/s
Area-P 1/2: 4.06 cm2
S' Lateral: 3.3 cm

## 2021-12-07 DIAGNOSIS — D509 Iron deficiency anemia, unspecified: Secondary | ICD-10-CM | POA: Diagnosis not present

## 2021-12-07 DIAGNOSIS — Z1389 Encounter for screening for other disorder: Secondary | ICD-10-CM | POA: Diagnosis not present

## 2021-12-07 DIAGNOSIS — Z6829 Body mass index (BMI) 29.0-29.9, adult: Secondary | ICD-10-CM | POA: Diagnosis not present

## 2021-12-07 DIAGNOSIS — Z0000 Encounter for general adult medical examination without abnormal findings: Secondary | ICD-10-CM | POA: Diagnosis not present

## 2021-12-19 DIAGNOSIS — F331 Major depressive disorder, recurrent, moderate: Secondary | ICD-10-CM | POA: Insufficient documentation

## 2021-12-19 DIAGNOSIS — D509 Iron deficiency anemia, unspecified: Secondary | ICD-10-CM | POA: Diagnosis not present

## 2021-12-19 DIAGNOSIS — Z1389 Encounter for screening for other disorder: Secondary | ICD-10-CM | POA: Diagnosis not present

## 2021-12-27 ENCOUNTER — Inpatient Hospital Stay: Payer: 59 | Attending: Oncology | Admitting: Oncology

## 2021-12-27 ENCOUNTER — Inpatient Hospital Stay: Payer: 59

## 2021-12-27 ENCOUNTER — Encounter: Payer: Self-pay | Admitting: Oncology

## 2021-12-27 VITALS — BP 112/75 | HR 75 | Temp 97.5°F | Resp 18 | Ht 65.0 in | Wt 191.6 lb

## 2021-12-27 DIAGNOSIS — D509 Iron deficiency anemia, unspecified: Secondary | ICD-10-CM | POA: Diagnosis not present

## 2021-12-27 DIAGNOSIS — R5383 Other fatigue: Secondary | ICD-10-CM | POA: Diagnosis not present

## 2021-12-27 DIAGNOSIS — Z79899 Other long term (current) drug therapy: Secondary | ICD-10-CM | POA: Insufficient documentation

## 2021-12-27 DIAGNOSIS — D508 Other iron deficiency anemias: Secondary | ICD-10-CM | POA: Diagnosis not present

## 2021-12-27 LAB — RETIC PANEL
Immature Retic Fract: 15.6 % (ref 2.3–15.9)
RBC.: 4.56 MIL/uL (ref 3.87–5.11)
Retic Count, Absolute: 60.6 10*3/uL (ref 19.0–186.0)
Retic Ct Pct: 1.3 % (ref 0.4–3.1)
Reticulocyte Hemoglobin: 26.6 pg — ABNORMAL LOW (ref >27.9)

## 2021-12-27 LAB — COMPREHENSIVE METABOLIC PANEL
ALT: 11 U/L (ref 0–44)
AST: 15 U/L (ref 15–41)
Albumin: 3.9 g/dL (ref 3.5–5.0)
Alkaline Phosphatase: 81 U/L (ref 38–126)
Anion gap: 3 — ABNORMAL LOW (ref 5–15)
BUN: 10 mg/dL (ref 6–20)
CO2: 29 mmol/L (ref 22–32)
Calcium: 8.7 mg/dL — ABNORMAL LOW (ref 8.9–10.3)
Chloride: 105 mmol/L (ref 98–111)
Creatinine, Ser: 0.8 mg/dL (ref 0.44–1.00)
GFR, Estimated: >60 mL/min (ref >60)
Glucose, Bld: 94 mg/dL (ref 70–99)
Potassium: 3.9 mmol/L (ref 3.5–5.1)
Sodium: 137 mmol/L (ref 135–145)
Total Bilirubin: 0.4 mg/dL (ref 0.3–1.2)
Total Protein: 7.6 g/dL (ref 6.5–8.1)

## 2021-12-27 LAB — CBC WITH DIFFERENTIAL/PLATELET
Abs Immature Granulocytes: 0.01 10*3/uL (ref 0.00–0.07)
Basophils Absolute: 0 10*3/uL (ref 0.0–0.1)
Basophils Relative: 0 %
Eosinophils Absolute: 0.1 10*3/uL (ref 0.0–0.5)
Eosinophils Relative: 2 %
HCT: 35.9 % — ABNORMAL LOW (ref 36.0–46.0)
Hemoglobin: 10.9 g/dL — ABNORMAL LOW (ref 12.0–15.0)
Immature Granulocytes: 0 %
Lymphocytes Relative: 42 %
Lymphs Abs: 2.4 10*3/uL (ref 0.7–4.0)
MCH: 24.1 pg — ABNORMAL LOW (ref 26.0–34.0)
MCHC: 30.4 g/dL (ref 30.0–36.0)
MCV: 79.2 fL — ABNORMAL LOW (ref 80.0–100.0)
Monocytes Absolute: 0.3 10*3/uL (ref 0.1–1.0)
Monocytes Relative: 5 %
Neutro Abs: 3 10*3/uL (ref 1.7–7.7)
Neutrophils Relative %: 51 %
Platelets: 297 10*3/uL (ref 150–400)
RBC: 4.53 MIL/uL (ref 3.87–5.11)
RDW: 15.3 % (ref 11.5–15.5)
WBC: 5.8 10*3/uL (ref 4.0–10.5)
nRBC: 0 % (ref 0.0–0.2)

## 2021-12-27 LAB — VITAMIN B12: Vitamin B-12: 323 pg/mL (ref 180–914)

## 2021-12-27 LAB — TECHNOLOGIST SMEAR REVIEW: Plt Morphology: ADEQUATE

## 2021-12-27 LAB — IRON AND TIBC
Iron: 35 ug/dL (ref 28–170)
Saturation Ratios: 9 % — ABNORMAL LOW (ref 10.4–31.8)
TIBC: 386 ug/dL (ref 250–450)
UIBC: 351 ug/dL

## 2021-12-27 LAB — FERRITIN: Ferritin: 18 ng/mL (ref 11–307)

## 2021-12-27 LAB — FOLATE: Folate: 8.5 ng/mL (ref >5.9)

## 2021-12-27 NOTE — Assessment & Plan Note (Signed)
Check iron tibc ferritin, b12, folate, hemoglobinopathy evaluation.  Discussed that if labs confirm iron deficiency anemia, recommend IV venofer given that she has tried oral iron supplementation with no improvement.  I discussed about the potential risks including but not limited to allergic reactions/infusion reactions including anaphylactic reactions, phlebitis, high blood pressure, wheezing, SOB, skin rash, weight gain, leg swelling, headache, nausea and fatigue, etc. Patient desires to achieved higher level of iron faster for adequate hematopoesis. Plan IV venofer weekly x 4

## 2021-12-27 NOTE — Progress Notes (Signed)
Hematology/Oncology Consult note [phone] [phone]      Patient Care Team: Default, Provider, MD as PCP - General (Orthopedic Surgery)   REFERRING PROVIDER: Margaretha Sheffield, MD  CHIEF COMPLAINTS/REASON FOR VISIT:  Anemia  ASSESSMENT & PLAN:  IDA (iron deficiency anemia) Check iron tibc ferritin, b12, folate, hemoglobinopathy evaluation.  Discussed that if labs confirm iron deficiency anemia, recommend IV venofer given that she has tried oral iron supplementation with no improvement.  I discussed about the potential risks including but not limited to allergic reactions/infusion reactions including anaphylactic reactions, phlebitis, high blood pressure, wheezing, SOB, skin rash, weight gain, leg swelling, headache, nausea and fatigue, etc. Patient desires to achieved higher level of iron faster for adequate hematopoesis. Plan IV venofer weekly x 4   Orders Placed This Encounter  Procedures   Ferritin    Standing Status:   Future    Number of Occurrences:   1    Standing Expiration Date:   06/29/2022   Iron and TIBC    Standing Status:   Future    Number of Occurrences:   1    Standing Expiration Date:   12/28/2022   Technologist smear review    Standing Status:   Future    Number of Occurrences:   1    Standing Expiration Date:   12/28/2022   CBC with Differential/Platelet    Standing Status:   Future    Number of Occurrences:   1    Standing Expiration Date:   12/28/2022   Retic Panel    Standing Status:   Future    Number of Occurrences:   1    Standing Expiration Date:   12/28/2022   Comprehensive metabolic panel    Standing Status:   Future    Number of Occurrences:   1    Standing Expiration Date:   12/28/2022   Hgb Fractionation Cascade    Standing Status:   Future    Number of Occurrences:   1    Standing Expiration Date:   12/28/2022   Folate    Standing Status:   Future    Number of Occurrences:   1    Standing Expiration Date:    12/28/2022   Vitamin B12    Standing Status:   Future    Number of Occurrences:   1    Standing Expiration Date:   12/28/2022    All questions were answered. The patient knows to call the clinic with any problems, questions or concerns.  Earlie Server, MD, PhD [facility] Hematology Oncology 12/27/2021     HISTORY OF PRESENTING ILLNESS:  Monique Luna is a  31 y.o.  female with PMH listed below who was referred to me for anemia Reviewed patient's recent labs that was done.  6/29 Hb 10.5, ferritin tibc 340, iron sat 16,ferritin 24  06/09/21 hb 11.2 mcv 77 + fatigue Patient has taken oral iron supplementation once daily, able to tolerate She denies recent chest pain on exertion, shortness of breath on minimal exertion, pre-syncopal episodes, or palpitations She had not noticed any recent bleeding such as epistaxis, hematuria or hematochezia.  She denies any pica and eats a variety of diet.    MEDICAL HISTORY:  Past Medical History:  Diagnosis Date   Urethral diverticulum     SURGICAL HISTORY: Past Surgical History:  Procedure Laterality Date   BREAST REDUCTION SURGERY Bilateral 03/24/2021   Procedure: MAMMARY REDUCTION  (BREAST);  Surgeon: Cindra Presume, MD;  Location: [facility];  Service: Plastics;  Laterality: Bilateral;   CHOLECYSTECTOMY  2009   [facility]    SOCIAL HISTORY: Social History   Socioeconomic History   Marital status: Single    Spouse name: Not on file   Number of children: Not on file   Years of education: Not on file   Highest education level: Not on file  Occupational History   Not on file  Tobacco Use   Smoking status: Never   Smokeless tobacco: Never  Vaping Use   Vaping Use: Never used  Substance and Sexual Activity   Alcohol use: Not Currently    Comment: Rarely   Drug use: No   Sexual activity: Yes    Birth control/protection: I.U.D.  Other Topics Concern   Not on file  Social History Narrative   Not on file    Social Determinants of Health   Financial Resource Strain: Not on file  Food Insecurity: Not on file  Transportation Needs: Not on file  Physical Activity: Not on file  Stress: Not on file  Social Connections: Not on file  Intimate Partner Violence: Not on file    FAMILY HISTORY: Family History  Problem Relation Age of Onset   Hypertension Mother    Hypertension Father    Healthy Sister    Healthy Sister    Diabetes Maternal Grandmother    Migraines Maternal Grandmother    Heart disease Maternal Grandmother    Depression Maternal Grandmother    Anemia Maternal Grandmother     ALLERGIES:  has No Known Allergies.  MEDICATIONS:  Current Outpatient Medications  Medication Sig Dispense Refill   escitalopram (LEXAPRO) 10 MG tablet Take 10 mg by mouth daily.     ferrous sulfate 325 (65 FE) MG tablet Take 325 mg by mouth daily with breakfast.     SODIUM FLUORIDE 5000 SENSITIVE 1.1-5 % GEL Take by mouth.     valACYclovir (VALTREX) 1000 MG tablet Take by mouth. (Patient not taking: Reported on 12/27/2021)     No current facility-administered medications for this visit.    Review of Systems  Constitutional:  Positive for fatigue. Negative for appetite change, chills and fever.  HENT:   Negative for hearing loss and voice change.   Eyes:  Negative for eye problems.  Respiratory:  Negative for chest tightness and cough.   Cardiovascular:  Negative for chest pain.  Gastrointestinal:  Negative for abdominal distention, abdominal pain and blood in stool.  Endocrine: Negative for hot flashes.  Genitourinary:  Negative for difficulty urinating and frequency.   Musculoskeletal:  Negative for arthralgias.  Skin:  Negative for itching and rash.  Neurological:  Negative for extremity weakness.  Hematological:  Negative for adenopathy.  Psychiatric/Behavioral:  Negative for confusion.     PHYSICAL EXAMINATION: ECOG PERFORMANCE STATUS: 0 - Asymptomatic Vitals:   12/27/21 1513   BP: 112/75  Pulse: 75  Resp: 18  Temp: (!) 97.5 F (36.4 C)   Filed Weights   12/27/21 1513  Weight: 191 lb 9.6 oz (86.9 kg)    Physical Exam Constitutional:      General: She is not in acute distress. HENT:     Head: Normocephalic and atraumatic.  Eyes:     General: No scleral icterus. Cardiovascular:     Rate and Rhythm: Normal rate and regular rhythm.     Heart sounds: Normal heart sounds.  Pulmonary:     Effort: Pulmonary effort is normal. No respiratory distress.  Breath sounds: No wheezing.  Abdominal:     General: Bowel sounds are normal. There is no distension.     Palpations: Abdomen is soft.  Musculoskeletal:        General: No deformity. Normal range of motion.     Cervical back: Normal range of motion and neck supple.  Skin:    General: Skin is warm and dry.     Findings: No erythema or rash.  Neurological:     Mental Status: She is alert and oriented to [place], place, and time. Mental status is at baseline.     Cranial Nerves: No cranial nerve deficit.     Coordination: Coordination normal.  Psychiatric:        Mood and Affect: Mood normal.      LABORATORY DATA:  I have reviewed the data as listed Lab Results  Component Value Date   WBC 5.8 12/27/2021   HGB 10.9 (L) 12/27/2021   HCT 35.9 (L) 12/27/2021   MCV 79.2 (L) 12/27/2021   PLT 297 12/27/2021   Lab Results  Component Value Date   NA 137 12/27/2021   K 3.9 12/27/2021   CL 105 12/27/2021   CO2 29 12/27/2021      Component Value Date/Time   IRON 35 12/27/2021 1606   TIBC 386 12/27/2021 1606   FERRITIN 18 12/27/2021 1606   IRONPCTSAT 9 (L) 12/27/2021 1606     RADIOGRAPHIC STUDIES: I have personally reviewed the radiological images as listed and agreed with the findings in the report. No results found.

## 2021-12-27 NOTE — Addendum Note (Signed)
Addended by: Rickard Patience on: 12/27/2021 11:02 PM   Modules accepted: Orders

## 2021-12-28 ENCOUNTER — Telehealth: Payer: Self-pay | Admitting: Oncology

## 2021-12-28 ENCOUNTER — Telehealth: Payer: Self-pay

## 2021-12-28 NOTE — Telephone Encounter (Signed)
-----   Message from Rickard Patience, MD sent at 12/27/2021 11:01 PM EDT ----- Please let patient now that her labs confirmed iron deficiency anemia.  Recommend IV venofer weekly x 4. Please arrange labs-pregnancy testing [ ordered], prior to Venofer weekly x4.  Follow up in 3 months. Labs prior to MD + venofer [labs ordered]

## 2021-12-28 NOTE — Telephone Encounter (Signed)
-----   Message from Zhou Yu, MD sent at 12/27/2021 11:01 PM EDT ----- Please let patient now that her labs confirmed iron deficiency anemia.  Recommend IV venofer weekly x 4. Please arrange labs-pregnancy testing [ ordered], prior to Venofer weekly x4.  Follow up in 3 months. Labs prior to MD + venofer [labs ordered] 

## 2021-12-28 NOTE — Telephone Encounter (Signed)
Spoke to pt and informed her of results and MD recommendation. Pt verbalized understanding. Pt states that she will do pregnancy urine test at the [facility] 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

## 2021-12-28 NOTE — Telephone Encounter (Signed)
Spoke with pt who confirmed appts

## 2022-01-01 ENCOUNTER — Other Ambulatory Visit
Admission: RE | Admit: 2022-01-01 | Discharge: 2022-01-01 | Disposition: A | Discharge status: Home / Self Care | Payer: 59 | Attending: Oncology | Admitting: Oncology

## 2022-01-01 DIAGNOSIS — D508 Other iron deficiency anemias: Secondary | ICD-10-CM | POA: Diagnosis not present

## 2022-01-01 LAB — HGB FRACTIONATION CASCADE
Hgb A2: 2.8 % (ref 1.8–3.2)
Hgb A: 97.2 % (ref 96.4–98.8)
Hgb F: 0 % (ref 0.0–2.0)
Hgb S: 0 %

## 2022-01-01 LAB — PREGNANCY, URINE: Preg Test, Ur: NEGATIVE

## 2022-01-02 ENCOUNTER — Inpatient Hospital Stay: Payer: 59

## 2022-01-02 VITALS — BP 116/78 | HR 81 | Temp 98.0°F | Resp 18

## 2022-01-02 DIAGNOSIS — R5383 Other fatigue: Secondary | ICD-10-CM | POA: Diagnosis not present

## 2022-01-02 DIAGNOSIS — D509 Iron deficiency anemia, unspecified: Secondary | ICD-10-CM | POA: Diagnosis not present

## 2022-01-02 DIAGNOSIS — D508 Other iron deficiency anemias: Secondary | ICD-10-CM

## 2022-01-02 DIAGNOSIS — Z79899 Other long term (current) drug therapy: Secondary | ICD-10-CM | POA: Diagnosis not present

## 2022-01-02 MED ORDER — SODIUM CHLORIDE 0.9 % IV SOLN
Freq: Once | INTRAVENOUS | Status: AC
  Filled 2022-01-02: qty 250

## 2022-01-02 MED ORDER — SODIUM CHLORIDE 0.9 % IV SOLN
200.0000 mg | Freq: Once | INTRAVENOUS | Status: AC
  Administered 2022-01-02: 200 mg via INTRAVENOUS
  Filled 2022-01-02: qty 200

## 2022-01-03 ENCOUNTER — Ambulatory Visit: Payer: 59

## 2022-01-09 ENCOUNTER — Inpatient Hospital Stay: Payer: 59 | Attending: Oncology

## 2022-01-09 VITALS — BP 127/80 | HR 80 | Temp 99.1°F | Resp 18

## 2022-01-09 DIAGNOSIS — Z79899 Other long term (current) drug therapy: Secondary | ICD-10-CM | POA: Insufficient documentation

## 2022-01-09 DIAGNOSIS — D509 Iron deficiency anemia, unspecified: Secondary | ICD-10-CM | POA: Diagnosis not present

## 2022-01-09 DIAGNOSIS — D508 Other iron deficiency anemias: Secondary | ICD-10-CM

## 2022-01-09 MED ORDER — SODIUM CHLORIDE 0.9 % IV SOLN
Freq: Once | INTRAVENOUS | Status: AC
  Filled 2022-01-09: qty 250

## 2022-01-09 MED ORDER — SODIUM CHLORIDE 0.9 % IV SOLN
200.0000 mg | Freq: Once | INTRAVENOUS | Status: AC
  Administered 2022-01-09: 200 mg via INTRAVENOUS
  Filled 2022-01-09: qty 200

## 2022-01-09 NOTE — Patient Instructions (Signed)
[facility] AT [place]  Discharge Instructions: Thank you for choosing [facility] to provide your oncology and hematology care.  If you have a lab appointment with the [facility], please go directly to the [facility] and check in at the registration area.  Wear comfortable clothing and clothing appropriate for easy access to any Portacath or PICC line.   We strive to give you quality time with your provider. You may need to reschedule your appointment if you arrive late (15 or more minutes).  Arriving late affects you and other patients whose appointments are after yours.  Also, if you miss three or more appointments without notifying the office, you may be dismissed from the clinic at the provider's discretion.      For prescription refill requests, have your pharmacy contact our office and allow 72 hours for refills to be completed.    Today you received the following chemotherapy and/or immunotherapy agents Venofer      To help prevent nausea and vomiting after your treatment, we encourage you to take your nausea medication as directed.  BELOW ARE SYMPTOMS THAT SHOULD BE REPORTED IMMEDIATELY: *FEVER GREATER THAN 100.4 F (38 C) OR HIGHER *CHILLS OR SWEATING *NAUSEA AND VOMITING THAT IS NOT CONTROLLED WITH YOUR NAUSEA MEDICATION *UNUSUAL SHORTNESS OF BREATH *UNUSUAL BRUISING OR BLEEDING *URINARY PROBLEMS (pain or burning when urinating, or frequent urination) *BOWEL PROBLEMS (unusual diarrhea, constipation, pain near the anus) TENDERNESS IN MOUTH AND THROAT WITH OR WITHOUT PRESENCE OF ULCERS (sore throat, sores in mouth, or a toothache) UNUSUAL RASH, SWELLING OR PAIN  UNUSUAL VAGINAL DISCHARGE OR ITCHING   Items with * indicate a potential emergency and should be followed up as soon as possible or go to the Emergency Department if any problems should occur.  Please show the CHEMOTHERAPY ALERT CARD or IMMUNOTHERAPY ALERT CARD at check-in to the  Emergency Department and triage nurse.  Should you have questions after your visit or need to cancel or reschedule your appointment, please contact [facility] CANCER [place] AT [place]  [phone] and follow the prompts.  Office hours are 8:00 a.m. to 4:30 p.m. Monday - Friday. Please note that voicemails left after 4:00 p.m. may not be returned until the following business day.  We are closed weekends and major holidays. You have access to a nurse at all times for urgent questions. Please call the main number to the clinic [phone] and follow the prompts.  For any non-urgent questions, you may also contact your provider using MyChart. We now offer e-Visits for anyone 63 and older to request care online for non-urgent symptoms. For details visit [email or web address].   Also download the MyChart app! Go to the app store, search "MyChart", open the app, select [facility], and log in with your MyChart username and password.  Masks are optional in the cancer centers. If you would like for your care team to wear a mask while they are taking care of you, please let them know. For doctor visits, patients may have with them one support Vezina who is at least 33 years old. At this time, visitors are not allowed in the infusion area.  Iron Sucrose Injection What is this medication? IRON SUCROSE (EYE ern SOO krose) treats low levels of iron (iron deficiency anemia) in people with kidney disease. Iron is a mineral that plays an important role in making red blood cells, which carry oxygen from your lungs to the rest of your body. This medicine may be  used for other purposes; ask your health care provider or pharmacist if you have questions. COMMON BRAND NAME(S): Venofer What should I tell my care team before I take this medication? They need to know if you have any of these conditions: Anemia not caused by low iron levels Heart disease High levels of iron in the blood Kidney disease Liver  disease An unusual or allergic reaction to iron, other medications, foods, dyes, or preservatives Pregnant or trying to get pregnant Breast-feeding How should I use this medication? This medication is for infusion into a vein. It is given in a hospital or clinic setting. Talk to your care team about the use of this medication in children. While this medication may be prescribed for children as young as 2 years for selected conditions, precautions do apply. Overdosage: If you think you have taken too much of this medicine contact a poison control center or emergency room at once. NOTE: This medicine is only for you. Do not share this medicine with others. What if I miss a dose? It is important not to miss your dose. Call your care team if you are unable to keep an appointment. What may interact with this medication? Do not take this medication with any of the following: Deferoxamine Dimercaprol Other iron products This medication may also interact with the following: Chloramphenicol Deferasirox This list may not describe all possible interactions. Give your health care provider a list of all the medicines, herbs, non-prescription drugs, or dietary supplements you use. Also tell them if you smoke, drink alcohol, or use illegal drugs. Some items may interact with your medicine. What should I watch for while using this medication? Visit your care team regularly. Tell your care team if your symptoms do not start to get better or if they get worse. You may need blood work done while you are taking this medication. You may need to follow a special diet. Talk to your care team. Foods that contain iron include: whole grains/cereals, dried fruits, beans, or peas, leafy green vegetables, and organ meats (liver, kidney). What side effects may I notice from receiving this medication? Side effects that you should report to your care team as soon as possible: Allergic reactions--skin rash, itching, hives,  swelling of the face, lips, tongue, or throat Low blood pressure--dizziness, feeling faint or lightheaded, blurry vision Shortness of breath Side effects that usually do not require medical attention (report to your care team if they continue or are bothersome): Flushing Headache Joint pain Muscle pain Nausea Pain, redness, or irritation at injection site This list may not describe all possible side effects. Call your doctor for medical advice about side effects. You may report side effects to FDA at 1-800-FDA-1088. Where should I keep my medication? This medication is given in a hospital or clinic and will not be stored at home. NOTE: This sheet is a summary. It may not cover all possible information. If you have questions about this medicine, talk to your doctor, pharmacist, or health care provider.  2023 Elsevier/Gold Standard (2020-10-21 00:00:00)

## 2022-01-10 ENCOUNTER — Ambulatory Visit: Payer: 59

## 2022-01-11 ENCOUNTER — Encounter: Payer: Self-pay | Admitting: Cardiology

## 2022-01-11 ENCOUNTER — Ambulatory Visit (INDEPENDENT_AMBULATORY_CARE_PROVIDER_SITE_OTHER): Payer: 59 | Admitting: Cardiology

## 2022-01-11 VITALS — BP 122/80 | HR 71 | Ht 65.0 in | Wt 190.0 lb

## 2022-01-11 DIAGNOSIS — R011 Cardiac murmur, unspecified: Secondary | ICD-10-CM

## 2022-01-11 DIAGNOSIS — R002 Palpitations: Secondary | ICD-10-CM | POA: Diagnosis not present

## 2022-01-11 DIAGNOSIS — D508 Other iron deficiency anemias: Secondary | ICD-10-CM | POA: Diagnosis not present

## 2022-01-11 NOTE — Patient Instructions (Signed)
Medication Instructions:  - Your physician recommends that you continue on your current medications as directed. Please refer to the Current Medication list given to you today.  *If you need a refill on your cardiac medications before your next appointment, please call your pharmacy*   Lab Work: - none ordered  If you have labs (blood work) drawn today and your tests are completely normal, you will receive your results only by: MyChart Message (if you have MyChart) OR A paper copy in the mail If you have any lab test that is abnormal or we need to change your treatment, we will call you to review the results.   Testing/Procedures: - none ordered   Follow-Up: At [facility], you and your health needs are our priority.  As part of our continuing mission to provide you with exceptional heart care, we have created designated Provider Care Teams.  These Care Teams include your primary Cardiologist (physician) and Advanced Practice Providers (APPs -  Physician Assistants and Nurse Practitioners) who all work together to provide you with the care you need, when you need it.  We recommend signing up for the patient portal called "MyChart".  Sign up information is provided on this After Visit Summary.  MyChart is used to connect with patients for Virtual Visits (Telemedicine).  Patients are able to view lab/test results, encounter notes, upcoming appointments, etc.  Non-urgent messages can be sent to your provider as well.   To learn more about what you can do with MyChart, go to [email or web address].    Your next appointment:   February 2024   The format for your next appointment:   In [place]  Provider:   Bryan Lemma, MD    Other Instructions N/a  Important Information About Sugar

## 2022-01-11 NOTE — Progress Notes (Signed)
Primary Care Provider: Default, Provider, MD Cardiologist: None Electrophysiologist: None  Clinic Note: Chief Complaint  Patient presents with   Follow-up    Follow up after Echo. Patient states that she is doing fine today.  Meds reviewed with patient.    ===================================  ASSESSMENT/PLAN   Problem List Items Addressed This Visit       Cardiology Problems   Systolic click-murmur syndrome (Chronic)    I do not hear either a systolic click or murmur.  No evidence of MVP on echo.  There was mild MR but just not likely what is heard on echo although it could be as a 60 mild flow murmur either from MR or across the aortic valve.  Regardless, nothing significant found.  Patient reassured.        Other   Rapid palpitations - Primary (Chronic)    Her episodes of not happen with the frequency to capture on a outpatient monitor.  Not significant enough to warrant ILR.  She does have a 15 ration Apple Watch which would hopefully be a pick up significant arrhythmia.  Thankfully, no recurrent symptoms since June.  Continue to recommend adequate hydration of up to 70 to 80 ounces of water a day with some being rehydration drinks such as G2 or liquid IV, propel Discussed biofeedback techniques as well as vagal maneuvers.   We will see her back in about 6 months to see how she is doing if stable then discharge to follow-up with PCP and return the as-needed basis.      IDA (iron deficiency anemia)    My suspicion is that her murmur is a flow murmur in the setting of anemia. She is feeling better as her anemia treated.  I also suspect that her palpitations will improve as her anemia improves.       ===================================  HPI:    Monique Luna is a 31 y.o. female with a PMH notable for Iron Deficiency Anemia who presents today for 68-month follow-up to discuss test results.  Monique Luna seen in initial consultation on Oct 19, 2021 for  evaluation of systolic murmur and possible systolic click as well as PRN tachycardia. => She noted that she had tachycardia spells when she started taking Victoza for obesity.  However it seemed like those episodes may have started 2 weeks prior to starting Victoza.  She started noticing tachycardia spells back in December 2022 with heart rates intermittently going up to 110s to 130 bpm on the Apple Watch monitor.  She noted that 10 spells since December but only one of them was when she felt a little overwhelmed injury as result.  No syncope or near syncope though.  Just lightheaded most of the time episodes last anywhere from 10 to 20 minutes but the one prominent episode lasted about 30 minutes.  Usually these occur when she is at rest.  Not associate with chest pain or pressure. => She also noted recently been started on iron supplementation for iron deficiency anemia as well as Lexapro for anxiety. 2D echo ordered. I did not order a monitor because she has a generation Apple watch which has a bit capacity to monitor for palpitations and she was not having a lot of frequency to capture on a short course monitor. Discussed importance of adequate hydration (7080 ounces water a day.  Also discussed vagal maneuvers and biofeedback techniques.  Recent Hospitalizations: None  Reviewed  CV studies:    The following studies were reviewed today: (if available,  images/films reviewed: From Epic Chart or Care Everywhere) TTE 11/21/2021: Technically difficult-poor echo windows.  Suboptimal apical and subcostal windows.  Definity contrast not used.  EF estimated at 60 to 65% no RWMA.  Normal diastolic pressures.  Normal RV size and function.  Only mild MR with no MVP.  Normal AOV.  Normal RAP.  Interval History:   Monique Luna returns here today for routine follow-up after her studies stating that she been doing very well since her last visit.  She had 1 episode of palpitations on June 10 that was associated  with being a little anxious.  Otherwise she has not had any more the spells.  That 1 lasted about 10 minutes and she was able to calm it down by drinking cold water and eating a snack. She never had any syncope or near syncopal symptoms with it.  Has not had any further symptoms since.  She has been pain close attention to adequate hydration.  She is also starting to get treatment for her anemia and is started feel more energy level.  No exercise-induced dyspnea or fatigue.  Note chest tightness or pressure.  No PND orthopnea or edema.  REVIEWED OF SYSTEMS   Review of Systems  Constitutional:  Negative for malaise/fatigue (Energy level doing better with her anemia being trended.).  HENT:  Negative for nosebleeds.   Respiratory:  Negative for shortness of breath.   Cardiovascular:        Per HPI  Gastrointestinal:  Negative for blood in stool and melena.  Genitourinary:  Negative for hematuria.  Musculoskeletal:  Positive for joint pain. Negative for falls.  Neurological:  Negative for dizziness.  Psychiatric/Behavioral: Negative.    All other systems reviewed and are negative.   I have reviewed and (if needed) personally updated the patient's problem list, medications, allergies, past medical and surgical history, social and family history.   PAST MEDICAL HISTORY   Past Medical History:  Diagnosis Date   Urethral diverticulum     PAST SURGICAL HISTORY   Past Surgical History:  Procedure Laterality Date   BREAST REDUCTION SURGERY Bilateral 03/24/2021   Procedure: MAMMARY REDUCTION  (BREAST);  Surgeon: Allena Napoleon, MD;  Location: [facility];  Service: Plastics;  Laterality: Bilateral;   CHOLECYSTECTOMY  2009   [facility]   TRANSTHORACIC ECHOCARDIOGRAM  11/21/2021   Technically difficult-poor echo windows.  Suboptimal apical and subcostal windows.  Definity contrast not used.  EF estimated at 60 to 65% no RWMA.  Normal diastolic pressures.  Normal RV size  and function.  Only mild MR with no MVP.  Normal AOV.  Normal RAP.     There is no immunization history on file for this patient.  MEDICATIONS/ALLERGIES   Current Meds  Medication Sig   escitalopram (LEXAPRO) 10 MG tablet Take 10 mg by mouth daily.   SODIUM FLUORIDE 5000 SENSITIVE 1.1-5 % GEL Take by mouth.   valACYclovir (VALTREX) 1000 MG tablet Take by mouth as needed.    No Known Allergies  SOCIAL HISTORY/FAMILY HISTORY   Reviewed in Epic:  Pertinent findings:  Social History   Tobacco Use   Smoking status: Never   Smokeless tobacco: Never  Vaping Use   Vaping Use: Never used  Substance Use Topics   Alcohol use: Not Currently    Comment: Rarely   Drug use: No   Social History   Social History Narrative   Not on file    OBJCTIVE -PE, EKG, labs  Wt Readings from Last 3 Encounters:  01/11/22 190 lb (86.2 kg)  12/27/21 191 lb 9.6 oz (86.9 kg)  10/19/21 187 lb (84.8 kg)    Physical Exam: BP 122/80 (BP Location: Left Arm, Patient Position: Sitting, Cuff Size: Normal)   Pulse 71   Ht 5\' 5"  (1.651 m)   Wt 190 lb (86.2 kg)   SpO2 100%   BMI 31.62 kg/m  Physical Exam Vitals reviewed.  Constitutional:      General: She is not in acute distress.    Appearance: Normal appearance. She is obese. She is not ill-appearing or toxic-appearing.  HENT:     Head: Normocephalic and atraumatic.  Neck:     Vascular: No carotid bruit.  Cardiovascular:     Rate and Rhythm: Normal rate and regular rhythm.     Pulses: Normal pulses.     Heart sounds: Murmur (Very soft 1/6 SEM at RUSB.  Barely AUDIBLE.) heard.     No friction rub. No gallop.  Pulmonary:     Effort: Pulmonary effort is normal. No respiratory distress.     Breath sounds: Normal breath sounds. No wheezing, rhonchi or rales.  Chest:     Chest wall: No tenderness.  Musculoskeletal:        General: No swelling. Normal range of motion.     Cervical back: Normal range of motion and neck supple.  Skin:     General: Skin is warm.  Neurological:     General: No focal deficit present.     Mental Status: She is alert and oriented to [place], place, and time.  Psychiatric:        Mood and Affect: Mood normal.        Behavior: Behavior normal.        Thought Content: Thought content normal.        Judgment: Judgment normal.     Adult ECG Report Not checked  Recent Labs: Reviewed No results found for: "CHOL", "HDL", "LDLCALC", "LDLDIRECT", "TRIG", "CHOLHDL" Lab Results  Component Value Date   CREATININE 0.80 12/27/2021   BUN 10 12/27/2021   NA 137 12/27/2021   K 3.9 12/27/2021   CL 105 12/27/2021   CO2 29 12/27/2021      Latest Ref Rng & Units 12/27/2021    4:06 PM  CBC  WBC 4.0 - 10.5 K/uL 5.8   Hemoglobin 12.0 - 15.0 g/dL 12/29/2021   Hematocrit 96.2 - 46.0 % 35.9   Platelets 150 - 400 K/uL 297     No results found for: "HGBA1C" No results found for: "TSH"  ================================================== I spent a total of 16 minutes with the patient spent in direct patient consultation.  Additional time spent with chart review  / charting (studies, outside notes, etc): 15 min Total Time: 31 min  Current medicines are reviewed at length with the patient today.  (+/- concerns) n/a  Notice: This dictation was prepared with Dragon dictation along with smart phrase technology. Any transcriptional errors that result from this process are unintentional and may not be corrected upon review.  Studies Ordered:  No orders of the defined types were placed in this encounter.  No orders of the defined types were placed in this encounter.   Patient Instructions / Medication Changes & Studies & Tests Ordered   Patient Instructions  Medication Instructions:  - Your physician recommends that you continue on your current medications as directed. Please refer to the Current Medication list given to you today.  *If you  need a refill on your cardiac medications before your next appointment,  please call your pharmacy*   Lab Work: - none ordered  If you have labs (blood work) drawn today and your tests are completely normal, you will receive your results only by: MyChart Message (if you have MyChart) OR A paper copy in the mail If you have any lab test that is abnormal or we need to change your treatment, we will call you to review the results.   Testing/Procedures: - none ordered   Follow-Up: At [facility], you and your health needs are our priority.  As part of our continuing mission to provide you with exceptional heart care, we have created designated Provider Care Teams.  These Care Teams include your primary Cardiologist (physician) and Advanced Practice Providers (APPs -  Physician Assistants and Nurse Practitioners) who all work together to provide you with the care you need, when you need it.  Your next appointment:   February 2024   The format for your next appointment:   In [place]  Provider:   Bryan Lemma, MD    Other Instructions N/a  Important Information About Sugar     Marykay Lex, MD, MS Bryan Lemma, M.D., M.S. Interventional Cardiologist   [facility]   [address] [place], [place]  [address] [phone]           Fax [phone]     Thank you for choosing [facility] in [place]!!

## 2022-01-12 ENCOUNTER — Encounter: Payer: Self-pay | Admitting: Cardiology

## 2022-01-12 NOTE — Assessment & Plan Note (Signed)
My suspicion is that her murmur is a flow murmur in the setting of anemia. She is feeling better as her anemia treated.  I also suspect that her palpitations will improve as her anemia improves.

## 2022-01-12 NOTE — Assessment & Plan Note (Signed)
Her episodes of not happen with the frequency to capture on a outpatient monitor.  Not significant enough to warrant ILR.  She does have a 15 ration Apple Watch which would hopefully be a pick up significant arrhythmia.  Thankfully, no recurrent symptoms since June.  Continue to recommend adequate hydration of up to 70 to 80 ounces of water a day with some being rehydration drinks such as G2 or liquid IV, propel Discussed biofeedback techniques as well as vagal maneuvers.   We will see her back in about 6 months to see how she is doing if stable then discharge to follow-up with PCP and return the as-needed basis.

## 2022-01-12 NOTE — Assessment & Plan Note (Signed)
I do not hear either a systolic click or murmur.  No evidence of MVP on echo.  There was mild MR but just not likely what is heard on echo although it could be as a 60 mild flow murmur either from MR or across the aortic valve.  Regardless, nothing significant found.  Patient reassured.

## 2022-01-16 ENCOUNTER — Inpatient Hospital Stay: Payer: 59

## 2022-01-16 ENCOUNTER — Encounter: Payer: Self-pay | Admitting: Oncology

## 2022-01-16 VITALS — BP 114/67 | HR 79 | Temp 96.8°F | Resp 18

## 2022-01-16 DIAGNOSIS — Z79899 Other long term (current) drug therapy: Secondary | ICD-10-CM | POA: Diagnosis not present

## 2022-01-16 DIAGNOSIS — D508 Other iron deficiency anemias: Secondary | ICD-10-CM

## 2022-01-16 DIAGNOSIS — D509 Iron deficiency anemia, unspecified: Secondary | ICD-10-CM | POA: Diagnosis not present

## 2022-01-16 MED ORDER — SODIUM CHLORIDE 0.9 % IV SOLN
200.0000 mg | Freq: Once | INTRAVENOUS | Status: AC
  Administered 2022-01-16: 200 mg via INTRAVENOUS
  Filled 2022-01-16: qty 200

## 2022-01-16 MED ORDER — SODIUM CHLORIDE 0.9 % IV SOLN
Freq: Once | INTRAVENOUS | Status: AC
  Filled 2022-01-16: qty 250

## 2022-01-16 NOTE — Patient Instructions (Signed)
[facility]  Discharge Instructions: Thank you for choosing [facility] to provide your oncology and hematology care.  If you have a lab appointment with the Cancer Center, please go directly to the Cancer Center and check in at the registration area.  Wear comfortable clothing and clothing appropriate for easy access to any Portacath or PICC line.   We strive to give you quality time with your provider. You may need to reschedule your appointment if you arrive late (15 or more minutes).  Arriving late affects you and other patients whose appointments are after yours.  Also, if you miss three or more appointments without notifying the office, you may be dismissed from the clinic at the provider's discretion.      For prescription refill requests, have your pharmacy contact our office and allow 72 hours for refills to be completed.    Today you received the following chemotherapy and/or immunotherapy agents VENOFER      To help prevent nausea and vomiting after your treatment, we encourage you to take your nausea medication as directed.  BELOW ARE SYMPTOMS THAT SHOULD BE REPORTED IMMEDIATELY: *FEVER GREATER THAN 100.4 F (38 C) OR HIGHER *CHILLS OR SWEATING *NAUSEA AND VOMITING THAT IS NOT CONTROLLED WITH YOUR NAUSEA MEDICATION *UNUSUAL SHORTNESS OF BREATH *UNUSUAL BRUISING OR BLEEDING *URINARY PROBLEMS (pain or burning when urinating, or frequent urination) *BOWEL PROBLEMS (unusual diarrhea, constipation, pain near the anus) TENDERNESS IN MOUTH AND THROAT WITH OR WITHOUT PRESENCE OF ULCERS (sore throat, sores in mouth, or a toothache) UNUSUAL RASH, SWELLING OR PAIN  UNUSUAL VAGINAL DISCHARGE OR ITCHING   Items with * indicate a potential emergency and should be followed up as soon as possible or go to the Emergency Department if any problems should occur.  Please show the CHEMOTHERAPY ALERT CARD or IMMUNOTHERAPY ALERT CARD at check-in to the  Emergency Department and triage nurse.  Should you have questions after your visit or need to cancel or reschedule your appointment, please contact [facility]  [phone] and follow the prompts.  Office hours are 8:00 a.m. to 4:30 p.m. Monday - Friday. Please note that voicemails left after 4:00 p.m. may not be returned until the following business day.  We are closed weekends and major holidays. You have access to a nurse at all times for urgent questions. Please call the main number to the clinic [phone] and follow the prompts.  For any non-urgent questions, you may also contact your provider using MyChart. We now offer e-Visits for anyone 18 and older to request care online for non-urgent symptoms. For details visit [email or web address].   Also download the MyChart app! Go to the app store, search "MyChart", open the app, select , and log in with your MyChart username and password.  Masks are optional in the cancer centers. If you would like for your care team to wear a mask while they are taking care of you, please let them know. For doctor visits, patients may have with them one support Dandy who is at least 33 years old. At this time, visitors are not allowed in the infusion area.  Iron Sucrose Injection What is this medication? IRON SUCROSE (EYE ern SOO krose) treats low levels of iron (iron deficiency anemia) in people with kidney disease. Iron is a mineral that plays an important role in making red blood cells, which carry oxygen from your lungs to the rest of your body. This medicine may be   used for other purposes; ask your health care provider or pharmacist if you have questions. COMMON BRAND NAME(S): Venofer What should I tell my care team before I take this medication? They need to know if you have any of these conditions: Anemia not caused by low iron levels Heart disease High levels of iron in the blood Kidney disease Liver  disease An unusual or allergic reaction to iron, other medications, foods, dyes, or preservatives Pregnant or trying to get pregnant Breast-feeding How should I use this medication? This medication is for infusion into a vein. It is given in a hospital or clinic setting. Talk to your care team about the use of this medication in children. While this medication may be prescribed for children as young as 2 years for selected conditions, precautions do apply. Overdosage: If you think you have taken too much of this medicine contact a poison control center or emergency room at once. NOTE: This medicine is only for you. Do not share this medicine with others. What if I miss a dose? It is important not to miss your dose. Call your care team if you are unable to keep an appointment. What may interact with this medication? Do not take this medication with any of the following: Deferoxamine Dimercaprol Other iron products This medication may also interact with the following: Chloramphenicol Deferasirox This list may not describe all possible interactions. Give your health care provider a list of all the medicines, herbs, non-prescription drugs, or dietary supplements you use. Also tell them if you smoke, drink alcohol, or use illegal drugs. Some items may interact with your medicine. What should I watch for while using this medication? Visit your care team regularly. Tell your care team if your symptoms do not start to get better or if they get worse. You may need blood work done while you are taking this medication. You may need to follow a special diet. Talk to your care team. Foods that contain iron include: whole grains/cereals, dried fruits, beans, or peas, leafy green vegetables, and organ meats (liver, kidney). What side effects may I notice from receiving this medication? Side effects that you should report to your care team as soon as possible: Allergic reactions--skin rash, itching, hives,  swelling of the face, lips, tongue, or throat Low blood pressure--dizziness, feeling faint or lightheaded, blurry vision Shortness of breath Side effects that usually do not require medical attention (report to your care team if they continue or are bothersome): Flushing Headache Joint pain Muscle pain Nausea Pain, redness, or irritation at injection site This list may not describe all possible side effects. Call your doctor for medical advice about side effects. You may report side effects to FDA at 1-800-FDA-1088. Where should I keep my medication? This medication is given in a hospital or clinic and will not be stored at home. NOTE: This sheet is a summary. It may not cover all possible information. If you have questions about this medicine, talk to your doctor, pharmacist, or health care provider.  2023 Elsevier/Gold Standard (2007-07-19 00:00:00)   

## 2022-01-17 ENCOUNTER — Ambulatory Visit: Payer: 59

## 2022-01-18 DIAGNOSIS — F331 Major depressive disorder, recurrent, moderate: Secondary | ICD-10-CM | POA: Diagnosis not present

## 2022-01-23 ENCOUNTER — Inpatient Hospital Stay: Payer: 59

## 2022-01-23 VITALS — BP 118/74 | HR 96 | Temp 98.7°F | Resp 20

## 2022-01-23 DIAGNOSIS — D509 Iron deficiency anemia, unspecified: Secondary | ICD-10-CM | POA: Diagnosis not present

## 2022-01-23 DIAGNOSIS — Z79899 Other long term (current) drug therapy: Secondary | ICD-10-CM | POA: Diagnosis not present

## 2022-01-23 DIAGNOSIS — D508 Other iron deficiency anemias: Secondary | ICD-10-CM

## 2022-01-23 MED ORDER — SODIUM CHLORIDE 0.9 % IV SOLN
200.0000 mg | Freq: Once | INTRAVENOUS | Status: AC
  Administered 2022-01-23: 200 mg via INTRAVENOUS
  Filled 2022-01-23: qty 200

## 2022-01-23 MED ORDER — SODIUM CHLORIDE 0.9 % IV SOLN
Freq: Once | INTRAVENOUS | Status: AC
  Filled 2022-01-23: qty 250

## 2022-01-24 ENCOUNTER — Ambulatory Visit: Payer: 59

## 2022-01-29 DIAGNOSIS — Z0000 Encounter for general adult medical examination without abnormal findings: Secondary | ICD-10-CM | POA: Diagnosis not present

## 2022-01-29 DIAGNOSIS — F331 Major depressive disorder, recurrent, moderate: Secondary | ICD-10-CM | POA: Diagnosis not present

## 2022-02-05 DIAGNOSIS — Z111 Encounter for screening for respiratory tuberculosis: Secondary | ICD-10-CM | POA: Diagnosis not present

## 2022-02-05 DIAGNOSIS — R7611 Nonspecific reaction to tuberculin skin test without active tuberculosis: Secondary | ICD-10-CM | POA: Diagnosis not present

## 2022-02-05 DIAGNOSIS — Z23 Encounter for immunization: Secondary | ICD-10-CM | POA: Diagnosis not present

## 2022-02-08 DIAGNOSIS — D509 Iron deficiency anemia, unspecified: Secondary | ICD-10-CM | POA: Diagnosis not present

## 2022-02-16 DIAGNOSIS — S8011XA Contusion of right lower leg, initial encounter: Secondary | ICD-10-CM | POA: Diagnosis not present

## 2022-02-19 DIAGNOSIS — F331 Major depressive disorder, recurrent, moderate: Secondary | ICD-10-CM | POA: Diagnosis not present

## 2022-02-19 DIAGNOSIS — Z111 Encounter for screening for respiratory tuberculosis: Secondary | ICD-10-CM | POA: Diagnosis not present

## 2022-02-22 ENCOUNTER — Encounter: Payer: Self-pay | Admitting: *Deleted

## 2022-02-22 ENCOUNTER — Other Ambulatory Visit: Payer: Self-pay

## 2022-02-22 DIAGNOSIS — R2240 Localized swelling, mass and lump, unspecified lower limb: Secondary | ICD-10-CM | POA: Insufficient documentation

## 2022-02-22 DIAGNOSIS — N9489 Other specified conditions associated with female genital organs and menstrual cycle: Secondary | ICD-10-CM | POA: Insufficient documentation

## 2022-02-22 DIAGNOSIS — M7989 Other specified soft tissue disorders: Secondary | ICD-10-CM | POA: Diagnosis not present

## 2022-02-22 DIAGNOSIS — F419 Anxiety disorder, unspecified: Secondary | ICD-10-CM | POA: Insufficient documentation

## 2022-02-22 DIAGNOSIS — W2107XA Struck by softball, initial encounter: Secondary | ICD-10-CM | POA: Diagnosis not present

## 2022-02-22 DIAGNOSIS — Y9364 Activity, baseball: Secondary | ICD-10-CM | POA: Diagnosis not present

## 2022-02-22 DIAGNOSIS — S8991XA Unspecified injury of right lower leg, initial encounter: Secondary | ICD-10-CM | POA: Diagnosis not present

## 2022-02-22 DIAGNOSIS — R600 Localized edema: Secondary | ICD-10-CM | POA: Insufficient documentation

## 2022-02-22 DIAGNOSIS — F329 Major depressive disorder, single episode, unspecified: Secondary | ICD-10-CM | POA: Insufficient documentation

## 2022-02-22 LAB — CBC
HCT: 38.1 % (ref 36.0–46.0)
Hemoglobin: 11.5 g/dL — ABNORMAL LOW (ref 12.0–15.0)
MCH: 23.8 pg — ABNORMAL LOW (ref 26.0–34.0)
MCHC: 30.2 g/dL (ref 30.0–36.0)
MCV: 78.7 fL — ABNORMAL LOW (ref 80.0–100.0)
Platelets: 251 10*3/uL (ref 150–400)
RBC: 4.84 MIL/uL (ref 3.87–5.11)
RDW: 16.6 % — ABNORMAL HIGH (ref 11.5–15.5)
WBC: 7.1 10*3/uL (ref 4.0–10.5)
nRBC: 0 % (ref 0.0–0.2)

## 2022-02-22 LAB — BASIC METABOLIC PANEL
Anion gap: 9 (ref 5–15)
BUN: 15 mg/dL (ref 6–20)
CO2: 28 mmol/L (ref 22–32)
Calcium: 9.1 mg/dL (ref 8.9–10.3)
Chloride: 104 mmol/L (ref 98–111)
Creatinine, Ser: 1.01 mg/dL — ABNORMAL HIGH (ref 0.44–1.00)
GFR, Estimated: >60 mL/min (ref >60)
Glucose, Bld: 108 mg/dL — ABNORMAL HIGH (ref 70–99)
Potassium: 3.6 mmol/L (ref 3.5–5.1)
Sodium: 141 mmol/L (ref 135–145)

## 2022-02-22 NOTE — ED Triage Notes (Signed)
Pt has swelling to lower legs and hands for 1 month.  Pt had injury to right lower leg recently and had xrays.  No chest pain or sob.  Pt alert  speech clear.

## 2022-02-23 ENCOUNTER — Emergency Department
Admission: EM | Admit: 2022-02-23 | Discharge: 2022-02-23 | Disposition: A | Discharge status: Home / Self Care | Payer: 59 | Attending: Emergency Medicine | Admitting: Emergency Medicine

## 2022-02-23 ENCOUNTER — Emergency Department: Payer: 59

## 2022-02-23 DIAGNOSIS — M7989 Other specified soft tissue disorders: Secondary | ICD-10-CM | POA: Diagnosis not present

## 2022-02-23 DIAGNOSIS — S8991XA Unspecified injury of right lower leg, initial encounter: Secondary | ICD-10-CM | POA: Diagnosis not present

## 2022-02-23 DIAGNOSIS — R609 Edema, unspecified: Secondary | ICD-10-CM

## 2022-02-23 DIAGNOSIS — N9489 Other specified conditions associated with female genital organs and menstrual cycle: Secondary | ICD-10-CM | POA: Diagnosis not present

## 2022-02-23 DIAGNOSIS — R600 Localized edema: Secondary | ICD-10-CM | POA: Diagnosis not present

## 2022-02-23 LAB — URINALYSIS, ROUTINE W REFLEX MICROSCOPIC
Bilirubin Urine: NEGATIVE
Glucose, UA: NEGATIVE mg/dL
Hgb urine dipstick: NEGATIVE
Ketones, ur: NEGATIVE mg/dL
Leukocytes,Ua: NEGATIVE
Nitrite: NEGATIVE
Protein, ur: NEGATIVE mg/dL
Specific Gravity, Urine: 1.027 (ref 1.005–1.030)
pH: 5 (ref 5.0–8.0)

## 2022-02-23 LAB — HCG, QUANTITATIVE, PREGNANCY: hCG, Beta Chain, Quant, S: <1 m[IU]/mL (ref <5)

## 2022-02-23 LAB — BRAIN NATRIURETIC PEPTIDE: B Natriuretic Peptide: 11 pg/mL (ref 0.0–100.0)

## 2022-02-23 NOTE — Discharge Instructions (Signed)
I recommend increasing your fluid intake and eating a low-salt diet.  I recommend compression stockings and keeping your legs elevated when at rest.   Steps to find a Primary Care Provider (PCP):  Call [phone] or [phone] to access "[facility] Find a Doctor Service."  2.  You may also go on the [facility] website at [email or web address]

## 2022-02-23 NOTE — ED Provider Notes (Signed)
[facility] Provider Note    Event Date/Time   First MD Initiated Contact with Patient 02/23/22 0023     (approximate)   History   Leg Swelling   HPI  Monique Luna is a 31 y.o. female with no significant past medical history who presents to the emergency department with complaints of bilateral ankle and hand swelling.  She states symptoms all started about 4 weeks ago when she was playing softball and a ball hit her in the right shin.  She states she had swelling around the area of the injury and then had some swelling in the ankle and foot which she contributed to the injury.  She was seen in urgent care and had x-rays that she was told were negative.  She has been wearing a compression dressing to this leg but then started noticing that her other ankle is also swollen and then will have intermittent swelling or hands as well.  No chest pain or difficulty breathing.  No history of CHF, kidney disease.  States she messaged her doctor on line and they recommended that she come to the emergency department.   History provided by patient and significant other.    Past Medical History:  Diagnosis Date   Urethral diverticulum     Past Surgical History:  Procedure Laterality Date   BREAST REDUCTION SURGERY Bilateral 03/24/2021   Procedure: MAMMARY REDUCTION  (BREAST);  Surgeon: Allena Napoleon, MD;  Location: [facility];  Service: Plastics;  Laterality: Bilateral;   CHOLECYSTECTOMY  2009   [facility]   TRANSTHORACIC ECHOCARDIOGRAM  11/21/2021   Technically difficult-poor echo windows.  Suboptimal apical and subcostal windows.  Definity contrast not used.  EF estimated at 60 to 65% no RWMA.  Normal diastolic pressures.  Normal RV size and function.  Only mild MR with no MVP.  Normal AOV.  Normal RAP.    MEDICATIONS:  Prior to Admission medications   Medication Sig Start Date End Date Taking? Authorizing Provider  escitalopram (LEXAPRO)  10 MG tablet Take 10 mg by mouth daily. 12/19/21   [provider]  ferrous sulfate 325 (65 FE) MG tablet Take 325 mg by mouth daily with breakfast. Patient not taking: Reported on 01/11/2022    [provider]  SODIUM FLUORIDE 5000 SENSITIVE 1.1-5 % GEL Take by mouth. 12/20/21   [provider]  valACYclovir (VALTREX) 1000 MG tablet Take by mouth as needed. 10/02/21   [provider]    Physical Exam   Triage Vital Signs: ED Triage Vitals [02/22/22 2159]  Enc Vitals Group     BP 133/89     Pulse Rate 75     Resp 18     Temp 98 F (36.7 C)     Temp Source Oral     SpO2 100 %     Weight 196 lb (88.9 kg)     Height 5\' 5"  (1.651 m)     Head Circumference      Peak Flow      Pain Score 2     Pain Loc      Pain Edu?      Excl. in GC?     Most recent vital signs: Vitals:   02/22/22 2159 02/23/22 0225  BP: 133/89 129/80  Pulse: 75 70  Resp: 18 18  Temp: 98 F (36.7 C) 98.1 F (36.7 C)  SpO2: 100% 100%    CONSTITUTIONAL: Alert and oriented and responds appropriately  to questions. Well-appearing; well-nourished HEAD: Normocephalic, atraumatic EYES: Conjunctivae clear, pupils appear equal, sclera nonicteric ENT: normal nose; moist mucous membranes NECK: Supple, normal ROM CARD: RRR; S1 and S2 appreciated; no murmurs, no clicks, no rubs, no gallops RESP: Normal chest excursion without splinting or tachypnea; breath sounds clear and equal bilaterally; no wheezes, no rhonchi, no rales, no hypoxia or respiratory distress, speaking full sentences ABD/GI: Normal bowel sounds; non-distended; soft, non-tender, no rebound, no guarding, no peritoneal signs BACK: The back appears normal EXT: Normal ROM in all joints; no deformity noted, trace edema noted to the right foot and ankle; no cyanosis, extremities are warm and well-perfused, no calf tenderness or calf swelling, extremities are all warm and well perfused SKIN: Normal color for age and race; warm;  no rash on exposed skin NEURO: Moves all extremities equally, normal speech PSYCH: The patient's mood and manner are appropriate.   ED Results / Procedures / Treatments   LABS: (all labs ordered are listed, but only abnormal results are displayed) Labs Reviewed  BASIC METABOLIC PANEL - Abnormal; Notable for the following components:      Result Value   Glucose, Bld 108 (*)    Creatinine, Ser 1.01 (*)    All other components within normal limits  CBC - Abnormal; Notable for the following components:   Hemoglobin 11.5 (*)    MCV 78.7 (*)    MCH 23.8 (*)    RDW 16.6 (*)    All other components within normal limits  URINALYSIS, ROUTINE W REFLEX MICROSCOPIC - Abnormal; Notable for the following components:   Color, Urine YELLOW (*)    APPearance HAZY (*)    All other components within normal limits  BRAIN NATRIURETIC PEPTIDE  HCG, QUANTITATIVE, PREGNANCY     EKG:   RADIOLOGY: My personal review and interpretation of imaging: Venous Doppler shows no DVT.  X-ray shows no fracture.  I have personally reviewed all radiology reports.   US Venous Img Lower Unilateral Right  Result Date: 02/23/2022 CLINICAL DATA:  Right leg swelling. Hit by softball in shin 1 month ago EXAM: RIGHT LOWER EXTREMITY VENOUS DOPPLER ULTRASOUND TECHNIQUE: Gray-scale sonography with compression, as well as color and duplex ultrasound, were performed to evaluate the deep venous system(s) from the level of the common femoral vein through the popliteal and proximal calf veins. COMPARISON:  None Available. FINDINGS: VENOUS Normal compressibility of the common femoral, superficial femoral, and popliteal veins, as well as the visualized calf veins. Visualized portions of profunda femoral vein and great saphenous vein unremarkable. No filling defects to suggest DVT on grayscale or color Doppler imaging. Doppler waveforms show normal direction of venous flow, normal respiratory plasticity and response to augmentation.  Limited views of the contralateral common femoral vein are unremarkable. OTHER Small hypoechoic areas within the soft tissues in the area of pain within the right shin. These measure 1.2 x 0.4 x 0.3 cm and 1.2 x 0.8 x 0.3 cm. These may reflect small liquified hematomas. Limitations: none IMPRESSION: No evidence of right lower extremity DVT. Small hypoechoic areas in the area of pain may reflect small liquified hematomas. Electronically Signed   By: Charlett Nose M.D.   On: 02/23/2022 01:45   DG Tibia/Fibula Right  Result Date: 02/23/2022 CLINICAL DATA:  Status post trauma to the mid right shin 1 month ago. EXAM: RIGHT TIBIA AND FIBULA - 2 VIEW COMPARISON:  None Available. FINDINGS: There is no evidence of fracture or other focal bone lesions. Soft tissues are unremarkable.  IMPRESSION: Negative. Electronically Signed   By: Aram Candela M.D.   On: 02/23/2022 01:03     PROCEDURES:  Critical Care performed: No     Procedures    IMPRESSION / MDM / ASSESSMENT AND PLAN / ED COURSE  I reviewed the triage vital signs and the nursing notes.    Patient sent here by her primary care doctor for extremity swelling.  She has trace swelling in the right lower extremity only on exam currently and this is the leg that she just recently had a contusion to the shin.     DIFFERENTIAL DIAGNOSIS (includes but not limited to):   Contusion, edema secondary to standing on her feet for long periods of time, doubt CHF, nephritic or nephrotic syndrome.  Differential also includes fracture, DVT of the right leg.   Patient's presentation is most consistent with acute presentation with potential threat to life or bodily function.   PLAN: Labs show no leukocytosis.  Improving anemia compared to previous.  Normal renal function.  Normal electrolytes.  Normal BNP.  Will obtain x-ray of the right tib-fib and venous Doppler of the right leg.  Will obtain urinalysis to evaluate for hematuria, proteinuria.  She does  not appear significantly volume overloaded on exam and has no chest pain, shortness of breath and her vital signs are normal.  Have recommended compression stockings, increase water intake, low-sodium diet.   MEDICATIONS GIVEN IN ED: Medications - No data to display   ED COURSE: Patient's urinalysis shows no proteinuria or hematuria.  X-ray of the right tibia and fibula show no traumatic injury.  Venous Doppler shows no DVT.  She does have small hematomas related to her injury.  Recommended compression stockings, increase water, low sodium intake, keeping extremities elevated when at rest.  I do not feel she needs diuretics.  She has a PCP for follow-up.   At this time, I do not feel there is any life-threatening condition present. I reviewed all nursing notes, vitals, pertinent previous records.  All lab and urine results, EKGs, imaging ordered have been independently reviewed and interpreted by myself.  I reviewed all available radiology reports from any imaging ordered this visit.  Based on my assessment, I feel the patient is safe to be discharged home without further emergent workup and can continue workup as an outpatient as needed. Discussed all findings, treatment plan as well as usual and customary return precautions.  They verbalize understanding and are comfortable with this plan.  Outpatient follow-up has been provided as needed.  All questions have been answered.    CONSULTS:  none   OUTSIDE RECORDS REVIEWED: Reviewed patient's last office visit with Genia Del on 05/25/2019.       FINAL CLINICAL IMPRESSION(S) / ED DIAGNOSES   Final diagnoses:  Peripheral edema     Rx / DC Orders   ED Discharge Orders     None        Note:  This document was prepared using Dragon voice recognition software and may include unintentional dictation errors.   Orella Cushman, Layla Maw, DO 02/23/22 [phone]

## 2022-03-19 DIAGNOSIS — F331 Major depressive disorder, recurrent, moderate: Secondary | ICD-10-CM | POA: Diagnosis not present

## 2022-03-26 ENCOUNTER — Other Ambulatory Visit
Admission: RE | Admit: 2022-03-26 | Discharge: 2022-03-26 | Disposition: A | Discharge status: Home / Self Care | Payer: 59 | Attending: Oncology | Admitting: Oncology

## 2022-03-26 DIAGNOSIS — D508 Other iron deficiency anemias: Secondary | ICD-10-CM | POA: Diagnosis not present

## 2022-03-26 LAB — IRON AND TIBC
Iron: 111 ug/dL (ref 28–170)
Saturation Ratios: 34 % — ABNORMAL HIGH (ref 10.4–31.8)
TIBC: 326 ug/dL (ref 250–450)
UIBC: 215 ug/dL

## 2022-03-26 LAB — CBC WITH DIFFERENTIAL/PLATELET
Abs Immature Granulocytes: 0.01 10*3/uL (ref 0.00–0.07)
Basophils Absolute: 0 10*3/uL (ref 0.0–0.1)
Basophils Relative: 1 %
Eosinophils Absolute: 0.1 10*3/uL (ref 0.0–0.5)
Eosinophils Relative: 2 %
HCT: 37.5 % (ref 36.0–46.0)
Hemoglobin: 11.7 g/dL — ABNORMAL LOW (ref 12.0–15.0)
Immature Granulocytes: 0 %
Lymphocytes Relative: 42 %
Lymphs Abs: 2.7 10*3/uL (ref 0.7–4.0)
MCH: 24.8 pg — ABNORMAL LOW (ref 26.0–34.0)
MCHC: 31.2 g/dL (ref 30.0–36.0)
MCV: 79.6 fL — ABNORMAL LOW (ref 80.0–100.0)
Monocytes Absolute: 0.3 10*3/uL (ref 0.1–1.0)
Monocytes Relative: 5 %
Neutro Abs: 3.2 10*3/uL (ref 1.7–7.7)
Neutrophils Relative %: 50 %
Platelets: 233 10*3/uL (ref 150–400)
RBC: 4.71 MIL/uL (ref 3.87–5.11)
RDW: 15.8 % — ABNORMAL HIGH (ref 11.5–15.5)
WBC: 6.4 10*3/uL (ref 4.0–10.5)
nRBC: 0 % (ref 0.0–0.2)

## 2022-03-26 LAB — PREGNANCY, URINE: Preg Test, Ur: NEGATIVE

## 2022-03-26 LAB — FERRITIN: Ferritin: 67 ng/mL (ref 11–307)

## 2022-03-27 ENCOUNTER — Other Ambulatory Visit: Payer: 59

## 2022-03-29 ENCOUNTER — Other Ambulatory Visit: Payer: 59

## 2022-03-29 ENCOUNTER — Encounter: Payer: Self-pay | Admitting: Oncology

## 2022-03-29 ENCOUNTER — Inpatient Hospital Stay: Payer: 59 | Attending: Oncology | Admitting: Oncology

## 2022-03-29 ENCOUNTER — Ambulatory Visit: Payer: 59

## 2022-03-29 VITALS — BP 120/70 | HR 91 | Temp 97.4°F | Resp 18 | Wt 199.7 lb

## 2022-03-29 DIAGNOSIS — D508 Other iron deficiency anemias: Secondary | ICD-10-CM

## 2022-03-29 DIAGNOSIS — D509 Iron deficiency anemia, unspecified: Secondary | ICD-10-CM | POA: Insufficient documentation

## 2022-03-29 NOTE — Assessment & Plan Note (Signed)
Labs are reviewed and discussed with patient. Stable and improved hemoglobin. Iron panel is stable.  No need for IV venofer

## 2022-03-29 NOTE — Progress Notes (Signed)
HEMATOLOGY-ONCOLOGY TeleHEALTH VISIT PROGRESS NOTE  I connected with Kenedi Trigg on 03/29/22  at  2:45 PM EDT by video enabled telemedicine visit and verified that I am speaking with the correct Monique Luna using two identifiers. I discussed the limitations, risks, security and privacy concerns of performing an evaluation and management service by telemedicine and the availability of in-[place] appointments. The patient expressed understanding and agreed to proceed.   Other persons participating in the visit and their role in the encounter:  None  Patient's location: In her vehicle, not driving Provider's location: office Chief Complaint:    Monique Luna is a 31 y.o. female who has above history reviewed by me today presents for follow up visit for management of iron deficiency anemia.  She has no new complaints.   Review of Systems  Constitutional:  Negative for appetite change, chills, fatigue and fever.  HENT:   Negative for hearing loss and voice change.   Eyes:  Negative for eye problems.  Respiratory:  Negative for chest tightness and cough.   Cardiovascular:  Negative for chest pain.  Gastrointestinal:  Negative for abdominal distention, abdominal pain and blood in stool.  Endocrine: Negative for hot flashes.  Genitourinary:  Negative for difficulty urinating and frequency.   Musculoskeletal:  Negative for arthralgias.  Skin:  Negative for itching and rash.  Neurological:  Negative for extremity weakness.  Hematological:  Negative for adenopathy.  Psychiatric/Behavioral:  Negative for confusion.     Past Medical History:  Diagnosis Date   Urethral diverticulum    Past Surgical History:  Procedure Laterality Date   BREAST REDUCTION SURGERY Bilateral 03/24/2021   Procedure: MAMMARY REDUCTION  (BREAST);  Surgeon: Cindra Presume, MD;  Location: [place];  Service: Plastics;  Laterality: Bilateral;   CHOLECYSTECTOMY  2009   [facility]    TRANSTHORACIC ECHOCARDIOGRAM  11/21/2021   Technically difficult-poor echo windows.  Suboptimal apical and subcostal windows.  Definity contrast not used.  EF estimated at 60 to 65% no RWMA.  Normal diastolic pressures.  Normal RV size and function.  Only mild MR with no MVP.  Normal AOV.  Normal RAP.    Family History  Problem Relation Age of Onset   Hypertension Mother    Hypertension Father    Healthy Sister    Healthy Sister    Diabetes Maternal Grandmother    Migraines Maternal Grandmother    Heart disease Maternal Grandmother    Depression Maternal Grandmother    Anemia Maternal Grandmother     Social History   Socioeconomic History   Marital status: Single    Spouse name: Not on file   Number of children: Not on file   Years of education: Not on file   Highest education level: Not on file  Occupational History   Not on file  Tobacco Use   Smoking status: Never   Smokeless tobacco: Never  Vaping Use   Vaping Use: Never used  Substance and Sexual Activity   Alcohol use: Not Currently    Comment: Rarely   Drug use: No   Sexual activity: Yes    Birth control/protection: I.U.D.  Other Topics Concern   Not on file  Social History Narrative   Not on file   Social Determinants of Health   Financial Resource Strain: Not on file  Food Insecurity: Not on file  Transportation Needs: Not on file  Physical Activity: Not on file  Stress: Not on file  Social Connections: Not  on file  Intimate Partner Violence: Not on file    Current Outpatient Medications on File Prior to Visit  Medication Sig Dispense Refill   escitalopram (LEXAPRO) 10 MG tablet Take 10 mg by mouth daily.     meloxicam (MOBIC) 15 MG tablet Take 15 mg by mouth 3 (three) times daily.     SODIUM FLUORIDE 5000 SENSITIVE 1.1-5 % GEL Take by mouth.     traZODone (DESYREL) 50 MG tablet Take by mouth.     valACYclovir (VALTREX) 1000 MG tablet Take by mouth as needed.     No current facility-administered  medications on file prior to visit.    No Known Allergies     Observations/Objective: Today's Vitals   03/29/22 1438  BP: 120/70  Pulse: 91  Resp: 18  Temp: (!) 97.4 F (36.3 C)  SpO2: 100%  Weight: 199 lb 11.2 oz (90.6 kg)  PainSc: 0-No pain   Body mass index is 33.23 kg/m.  Physical Exam   Labs    Latest Ref Rng & Units 03/26/2022   12:11 PM 02/22/2022   10:02 PM 12/27/2021    4:06 PM  CBC  WBC 4.0 - 10.5 K/uL 6.4  7.1  5.8   Hemoglobin 12.0 - 15.0 g/dL 93.8  18.2  99.3   Hematocrit 36.0 - 46.0 % 37.5  38.1  35.9   Platelets 150 - 400 K/uL 233  251  297       Latest Ref Rng & Units 02/22/2022   10:02 PM 12/27/2021    4:06 PM  CMP  Glucose 70 - 99 mg/dL 716  94   BUN 6 - 20 mg/dL 15  10   Creatinine 9.67 - 1.00 mg/dL 8.93  8.10   Sodium 175 - 145 mmol/L 141  137   Potassium 3.5 - 5.1 mmol/L 3.6  3.9   Chloride 98 - 111 mmol/L 104  105   CO2 22 - 32 mmol/L 28  29   Calcium 8.9 - 10.3 mg/dL 9.1  8.7   Total Protein 6.5 - 8.1 g/dL  7.6   Total Bilirubin 0.3 - 1.2 mg/dL  0.4   Alkaline Phos 38 - 126 U/L  81   AST 15 - 41 U/L  15   ALT 0 - 44 U/L  11    Lab Results  Component Value Date   IRON 111 03/26/2022   TIBC 326 03/26/2022   FERRITIN 67 03/26/2022   ASSESSMENT & PLAN:   IDA (iron deficiency anemia) Labs are reviewed and discussed with patient. Stable and improved hemoglobin. Iron panel is stable.  No need for IV venofer  Orders Placed This Encounter  Procedures   Iron and TIBC    Standing Status:   Future    Standing Expiration Date:   03/30/2023   CBC with Differential/Platelet    Standing Status:   Future    Standing Expiration Date:   03/30/2023   Ferritin    Standing Status:   Future    Standing Expiration Date:   03/30/2023   Pregnancy, urine    Standing Status:   Future    Standing Expiration Date:   03/30/2023   Follow up in 6 months.  All questions were answered. The patient knows to call the clinic with any problems, questions  or concerns.  Rickard Patience, MD, PhD [facility] Hematology Oncology 03/29/2022

## 2022-05-24 ENCOUNTER — Encounter: Payer: Self-pay | Admitting: Oncology

## 2022-05-26 ENCOUNTER — Encounter: Payer: Self-pay | Admitting: Oncology

## 2022-06-26 DIAGNOSIS — Z0000 Encounter for general adult medical examination without abnormal findings: Secondary | ICD-10-CM | POA: Diagnosis not present

## 2022-06-26 DIAGNOSIS — Z1389 Encounter for screening for other disorder: Secondary | ICD-10-CM | POA: Diagnosis not present

## 2022-06-26 DIAGNOSIS — D509 Iron deficiency anemia, unspecified: Secondary | ICD-10-CM | POA: Diagnosis not present

## 2022-06-26 DIAGNOSIS — Z7251 High risk heterosexual behavior: Secondary | ICD-10-CM | POA: Diagnosis not present

## 2022-06-26 DIAGNOSIS — Z124 Encounter for screening for malignant neoplasm of cervix: Secondary | ICD-10-CM | POA: Diagnosis not present

## 2022-06-26 DIAGNOSIS — E559 Vitamin D deficiency, unspecified: Secondary | ICD-10-CM | POA: Diagnosis not present

## 2022-06-26 DIAGNOSIS — F331 Major depressive disorder, recurrent, moderate: Secondary | ICD-10-CM | POA: Diagnosis not present

## 2022-07-31 ENCOUNTER — Other Ambulatory Visit: Payer: Self-pay

## 2022-07-31 ENCOUNTER — Encounter: Payer: Self-pay | Admitting: Oncology

## 2022-08-01 ENCOUNTER — Other Ambulatory Visit: Payer: Self-pay

## 2022-08-01 MED ORDER — ESCITALOPRAM OXALATE 10 MG PO TABS
10.0000 mg | ORAL_TABLET | Freq: Every day | ORAL | 10 refills | Status: DC
  Filled 2022-08-01: qty 30, 30d supply, fill #0
  Filled 2022-08-26 – 2022-09-28 (×2): qty 30, 30d supply, fill #1
  Filled 2022-10-17 – 2022-11-12 (×2): qty 30, 30d supply, fill #2
  Filled 2022-11-27 – 2022-12-31 (×3): qty 30, 30d supply, fill #3

## 2022-08-01 MED ORDER — WESTAB PLUS 27-1 MG PO TABS
1.0000 | ORAL_TABLET | Freq: Every day | ORAL | 3 refills | Status: DC
  Filled 2022-08-06: qty 100, 100d supply, fill #0
  Filled 2022-08-26: qty 90, 90d supply, fill #0

## 2022-08-01 MED ORDER — VALACYCLOVIR HCL 1 G PO TABS
1000.0000 mg | ORAL_TABLET | Freq: Every day | ORAL | 5 refills | Status: DC
  Filled 2022-08-28: qty 5, 5d supply, fill #0

## 2022-08-01 MED ORDER — TRAZODONE HCL 50 MG PO TABS
50.0000 mg | ORAL_TABLET | Freq: Every evening | ORAL | 10 refills | Status: DC
  Filled 2022-08-01: qty 60, 30d supply, fill #0
  Filled 2022-08-01: qty 30, 30d supply, fill #0
  Filled 2022-08-06 – 2022-09-28 (×3): qty 60, 30d supply, fill #1
  Filled 2022-10-17 – 2022-11-27 (×2): qty 60, 30d supply, fill #2
  Filled 2022-12-31: qty 60, 30d supply, fill #3

## 2022-08-01 MED ORDER — TRAZODONE HCL 50 MG PO TABS
50.0000 mg | ORAL_TABLET | Freq: Every day | ORAL | 10 refills | Status: DC
  Filled 2022-08-01 – 2022-08-08 (×3): qty 30, 30d supply, fill #0

## 2022-08-01 MED ORDER — FERROUS SULFATE 325 (65 FE) MG PO TABS
325.0000 mg | ORAL_TABLET | ORAL | 3 refills | Status: DC
  Filled 2022-08-06: qty 45, 90d supply, fill #0

## 2022-08-06 ENCOUNTER — Other Ambulatory Visit: Payer: Self-pay

## 2022-08-06 DIAGNOSIS — R6882 Decreased libido: Secondary | ICD-10-CM | POA: Diagnosis not present

## 2022-08-06 DIAGNOSIS — F520 Hypoactive sexual desire disorder: Secondary | ICD-10-CM | POA: Diagnosis not present

## 2022-08-06 DIAGNOSIS — Z309 Encounter for contraceptive management, unspecified: Secondary | ICD-10-CM | POA: Diagnosis not present

## 2022-08-06 MED ORDER — ETONOGESTREL-ETHINYL ESTRADIOL 0.12-0.015 MG/24HR VA RING
1.0000 | VAGINAL_RING | VAGINAL | 2 refills | Status: DC
  Filled 2022-08-06: qty 3, 84d supply, fill #0
  Filled 2022-10-17 – 2022-12-31 (×3): qty 3, 84d supply, fill #1
  Filled 2023-05-14: qty 3, 84d supply, fill #2

## 2022-08-07 ENCOUNTER — Encounter (HOSPITAL_COMMUNITY): Payer: Self-pay

## 2022-08-07 ENCOUNTER — Other Ambulatory Visit: Payer: Self-pay

## 2022-08-07 ENCOUNTER — Encounter: Payer: Self-pay | Admitting: Oncology

## 2022-08-07 ENCOUNTER — Other Ambulatory Visit (HOSPITAL_COMMUNITY): Payer: Self-pay

## 2022-08-08 ENCOUNTER — Encounter (HOSPITAL_BASED_OUTPATIENT_CLINIC_OR_DEPARTMENT_OTHER): Payer: Self-pay

## 2022-08-08 ENCOUNTER — Other Ambulatory Visit (HOSPITAL_BASED_OUTPATIENT_CLINIC_OR_DEPARTMENT_OTHER): Payer: Self-pay

## 2022-08-08 ENCOUNTER — Other Ambulatory Visit: Payer: Self-pay

## 2022-08-13 ENCOUNTER — Other Ambulatory Visit: Payer: Self-pay

## 2022-08-17 ENCOUNTER — Other Ambulatory Visit: Payer: Self-pay

## 2022-08-17 MED ORDER — BUPROPION HCL ER (XL) 150 MG PO TB24
150.0000 mg | ORAL_TABLET | Freq: Two times a day (BID) | ORAL | 2 refills | Status: DC
  Filled 2022-08-17: qty 60, 30d supply, fill #0
  Filled 2022-08-27 – 2022-09-28 (×2): qty 60, 30d supply, fill #1
  Filled 2022-10-17: qty 60, 30d supply, fill #2

## 2022-08-26 ENCOUNTER — Other Ambulatory Visit: Payer: Self-pay

## 2022-08-27 ENCOUNTER — Other Ambulatory Visit: Payer: Self-pay

## 2022-08-28 ENCOUNTER — Other Ambulatory Visit: Payer: Self-pay

## 2022-09-07 ENCOUNTER — Other Ambulatory Visit: Payer: Self-pay

## 2022-09-18 ENCOUNTER — Other Ambulatory Visit: Payer: Self-pay

## 2022-09-28 ENCOUNTER — Inpatient Hospital Stay: Payer: Commercial Managed Care - PPO | Attending: Oncology

## 2022-09-28 DIAGNOSIS — D509 Iron deficiency anemia, unspecified: Secondary | ICD-10-CM | POA: Insufficient documentation

## 2022-09-28 DIAGNOSIS — Z793 Long term (current) use of hormonal contraceptives: Secondary | ICD-10-CM | POA: Diagnosis not present

## 2022-09-28 DIAGNOSIS — Z79899 Other long term (current) drug therapy: Secondary | ICD-10-CM | POA: Diagnosis not present

## 2022-09-28 DIAGNOSIS — Z79624 Long term (current) use of inhibitors of nucleotide synthesis: Secondary | ICD-10-CM | POA: Insufficient documentation

## 2022-09-28 DIAGNOSIS — D508 Other iron deficiency anemias: Secondary | ICD-10-CM

## 2022-09-28 LAB — CBC WITH DIFFERENTIAL/PLATELET
Abs Immature Granulocytes: 0.01 10*3/uL (ref 0.00–0.07)
Basophils Absolute: 0 10*3/uL (ref 0.0–0.1)
Basophils Relative: 0 %
Eosinophils Absolute: 0.1 10*3/uL (ref 0.0–0.5)
Eosinophils Relative: 2 %
HCT: 39.1 % (ref 36.0–46.0)
Hemoglobin: 12.2 g/dL (ref 12.0–15.0)
Immature Granulocytes: 0 %
Lymphocytes Relative: 40 %
Lymphs Abs: 2.8 10*3/uL (ref 0.7–4.0)
MCH: 24.4 pg — ABNORMAL LOW (ref 26.0–34.0)
MCHC: 31.2 g/dL (ref 30.0–36.0)
MCV: 78.2 fL — ABNORMAL LOW (ref 80.0–100.0)
Monocytes Absolute: 0.4 10*3/uL (ref 0.1–1.0)
Monocytes Relative: 6 %
Neutro Abs: 3.7 10*3/uL (ref 1.7–7.7)
Neutrophils Relative %: 52 %
Platelets: 261 10*3/uL (ref 150–400)
RBC: 5 MIL/uL (ref 3.87–5.11)
RDW: 14.4 % (ref 11.5–15.5)
WBC: 7 10*3/uL (ref 4.0–10.5)
nRBC: 0 % (ref 0.0–0.2)

## 2022-09-28 LAB — PREGNANCY, URINE: Preg Test, Ur: NEGATIVE

## 2022-09-28 LAB — FERRITIN: Ferritin: 90 ng/mL (ref 11–307)

## 2022-09-28 LAB — IRON AND TIBC
Iron: 44 ug/dL (ref 28–170)
Saturation Ratios: 11 % (ref 10.4–31.8)
TIBC: 396 ug/dL (ref 250–450)
UIBC: 352 ug/dL

## 2022-10-05 ENCOUNTER — Inpatient Hospital Stay (HOSPITAL_BASED_OUTPATIENT_CLINIC_OR_DEPARTMENT_OTHER): Payer: Commercial Managed Care - PPO | Admitting: Oncology

## 2022-10-05 ENCOUNTER — Encounter: Payer: Self-pay | Admitting: Oncology

## 2022-10-05 DIAGNOSIS — Z793 Long term (current) use of hormonal contraceptives: Secondary | ICD-10-CM | POA: Diagnosis not present

## 2022-10-05 DIAGNOSIS — D508 Other iron deficiency anemias: Secondary | ICD-10-CM | POA: Diagnosis not present

## 2022-10-05 DIAGNOSIS — Z79899 Other long term (current) drug therapy: Secondary | ICD-10-CM | POA: Diagnosis not present

## 2022-10-05 DIAGNOSIS — Z79624 Long term (current) use of inhibitors of nucleotide synthesis: Secondary | ICD-10-CM | POA: Diagnosis not present

## 2022-10-05 DIAGNOSIS — D509 Iron deficiency anemia, unspecified: Secondary | ICD-10-CM | POA: Diagnosis not present

## 2022-10-05 MED ORDER — IRON-VITAMIN C 65-125 MG PO TABS: 1.0000 | ORAL_TABLET | Freq: Every day | ORAL | 1 refills | Status: DC

## 2022-10-05 NOTE — Assessment & Plan Note (Addendum)
Labs are reviewed and discussed with patient. Lab Results  Component Value Date   IRON 44 09/28/2022   TIBC 396 09/28/2022   IRONPCTSAT 11 09/28/2022   FERRITIN 90 09/28/2022   HGB 12.2 09/28/2022       Stable and improved hemoglobin. Iron panel is stable. No need for IV Venofer.  Iron sat is borderline.  Recommend patient to take Vitron C daily. Rx sent to pharmacy.

## 2022-10-05 NOTE — Progress Notes (Signed)
HEMATOLOGY-ONCOLOGY TeleHEALTH VISIT PROGRESS NOTE  I connected with Monique Luna on 10/05/22  at 12:15 PM EDT by video enabled telemedicine visit and verified that I am speaking with the correct [place] using two identifiers. I discussed the limitations, risks, security and privacy concerns of performing an evaluation and management service by telemedicine and the availability of in-[place] appointments. The patient expressed understanding and agreed to proceed.   Other persons participating in the visit and their role in the encounter:  None  Patient's location: In her vehicle, not driving Provider's location: office Chief Complaint:    INTERVAL HISTORY Monique Luna is a 32 y.o. female who has above history reviewed by me today presents for follow up visit for management of iron deficiency anemia.  She has no new complaints. She is now on Nuvaring.  Menstrual period is light now.   Review of Systems  Constitutional:  Negative for appetite change, chills, fatigue and fever.  HENT:   Negative for hearing loss and voice change.   Eyes:  Negative for eye problems.  Respiratory:  Negative for chest tightness and cough.   Cardiovascular:  Negative for chest pain.  Gastrointestinal:  Negative for abdominal distention, abdominal pain and blood in stool.  Endocrine: Negative for hot flashes.  Genitourinary:  Negative for difficulty urinating and frequency.   Musculoskeletal:  Negative for arthralgias.  Skin:  Negative for itching and rash.  Neurological:  Negative for extremity weakness.  Hematological:  Negative for adenopathy.  Psychiatric/Behavioral:  Negative for confusion.     Past Medical History:  Diagnosis Date   Urethral diverticulum    Past Surgical History:  Procedure Laterality Date   BREAST REDUCTION SURGERY Bilateral 03/24/2021   Procedure: MAMMARY REDUCTION  (BREAST);  Surgeon: Allena Napoleon, MD;  Location: [facility];  Service: Plastics;   Laterality: Bilateral;   CHOLECYSTECTOMY  2009   [facility]   TRANSTHORACIC ECHOCARDIOGRAM  11/21/2021   Technically difficult-poor echo windows.  Suboptimal apical and subcostal windows.  Definity contrast not used.  EF estimated at 60 to 65% no RWMA.  Normal diastolic pressures.  Normal RV size and function.  Only mild MR with no MVP.  Normal AOV.  Normal RAP.    Family History  Problem Relation Age of Onset   Hypertension Mother    Hypertension Father    Healthy Sister    Healthy Sister    Diabetes Maternal Grandmother    Migraines Maternal Grandmother    Heart disease Maternal Grandmother    Depression Maternal Grandmother    Anemia Maternal Grandmother     Social History   Socioeconomic History   Marital status: Single    Spouse name: Not on file   Number of children: Not on file   Years of education: Not on file   Highest education level: Not on file  Occupational History   Not on file  Tobacco Use   Smoking status: Never   Smokeless tobacco: Never  Vaping Use   Vaping Use: Never used  Substance and Sexual Activity   Alcohol use: Not Currently    Comment: Rarely   Drug use: No   Sexual activity: Yes    Birth control/protection: I.U.D.  Other Topics Concern   Not on file  Social History Narrative   Not on file   Social Determinants of Health   Financial Resource Strain: Not on file  Food Insecurity: Not on file  Transportation Needs: Not on file  Physical Activity: Not on  file  Stress: Not on file  Social Connections: Not on file  Intimate Partner Violence: Not on file    Current Outpatient Medications on File Prior to Visit  Medication Sig Dispense Refill   buPROPion (WELLBUTRIN XL) 150 MG 24 hr tablet Take 1 tablet (150 mg total) by mouth 2 (two) times daily. 60 tablet 2   escitalopram (LEXAPRO) 10 MG tablet Take 10 mg by mouth daily.     etonogestrel-ethinyl estradiol (NUVARING) 0.12-0.015 MG/24HR vaginal ring Place 1 ring vaginally for 3 weeks,  then remove for 1 week. 3 each 2   traZODone (DESYREL) 50 MG tablet Take 1 tablet (50 mg total) by mouth at bedtime as needed for sleep and mood. May increase to 2 tablets if needed. 60 tablet 10   valACYclovir (VALTREX) 1000 MG tablet Take by mouth as needed.     escitalopram (LEXAPRO) 10 MG tablet Take 1 tablet (10 mg total) by mouth once daily. 30 tablet 10   meloxicam (MOBIC) 15 MG tablet Take 15 mg by mouth 3 (three) times daily. (Patient not taking: Reported on 10/05/2022)     SODIUM FLUORIDE 5000 SENSITIVE 1.1-5 % GEL Take by mouth. (Patient not taking: Reported on 10/05/2022)     traZODone (DESYREL) 50 MG tablet Take by mouth. (Patient not taking: Reported on 10/05/2022)     traZODone (DESYREL) 50 MG tablet Take 1 tablet (50 mg total) by mouth at bedtime for sleep and mood. May increase to 2 tablets if needed. (Patient not taking: Reported on 10/05/2022) 30 tablet 10   valACYclovir (VALTREX) 1000 MG tablet Take 1 tablet (1,000 mg total) by mouth once daily. Can take 1/2 tablet daily for suppression after flare resolves. (Patient not taking: Reported on 10/05/2022) 5 tablet 5   No current facility-administered medications on file prior to visit.    No Known Allergies     Observations/Objective: There were no vitals filed for this visit.  There is no height or weight on file to calculate BMI.  Physical Exam Neurological:     Mental Status: She is alert.      Labs    Latest Ref Rng & Units 09/28/2022    2:12 PM 03/26/2022   12:11 PM 02/22/2022   10:02 PM  CBC  WBC 4.0 - 10.5 K/uL 7.0  6.4  7.1   Hemoglobin 12.0 - 15.0 g/dL 16.1  09.6  04.5   Hematocrit 36.0 - 46.0 % 39.1  37.5  38.1   Platelets 150 - 400 K/uL 261  233  251       Latest Ref Rng & Units 02/22/2022   10:02 PM 12/27/2021    4:06 PM  CMP  Glucose 70 - 99 mg/dL 409  94   BUN 6 - 20 mg/dL 15  10   Creatinine 8.11 - 1.00 mg/dL 9.14  7.82   Sodium 956 - 145 mmol/L 141  137   Potassium 3.5 - 5.1 mmol/L 3.6  3.9    Chloride 98 - 111 mmol/L 104  105   CO2 22 - 32 mmol/L 28  29   Calcium 8.9 - 10.3 mg/dL 9.1  8.7   Total Protein 6.5 - 8.1 g/dL  7.6   Total Bilirubin 0.3 - 1.2 mg/dL  0.4   Alkaline Phos 38 - 126 U/L  81   AST 15 - 41 U/L  15   ALT 0 - 44 U/L  11    Lab Results  Component Value Date   IRON  44 09/28/2022   TIBC 396 09/28/2022   FERRITIN 90 09/28/2022   ASSESSMENT & PLAN:   IDA (iron deficiency anemia) Labs are reviewed and discussed with patient. Lab Results  Component Value Date   IRON 44 09/28/2022   TIBC 396 09/28/2022   IRONPCTSAT 11 09/28/2022   FERRITIN 90 09/28/2022   HGB 12.2 09/28/2022       Stable and improved hemoglobin. Iron panel is stable. No need for IV Venofer.  Iron sat is borderline.  Recommend patient to take Vitron C daily. Rx sent to pharmacy.    Orders Placed This Encounter  Procedures   Alpha-Thalassemia GenotypR    Standing Status:   Future    Standing Expiration Date:   10/05/2023   CBC with Differential (Cancer Center Only)    Standing Status:   Future    Standing Expiration Date:   10/05/2023   Iron and TIBC    Standing Status:   Future    Standing Expiration Date:   10/05/2023   Ferritin    Standing Status:   Future    Standing Expiration Date:   10/05/2023   Follow up in 6 months.  All questions were answered. The patient knows to call the clinic with any problems, questions or concerns.  Rickard Patience, MD, PhD [facility] Hematology Oncology 10/05/2022

## 2022-10-17 ENCOUNTER — Other Ambulatory Visit: Payer: Self-pay

## 2022-10-22 ENCOUNTER — Other Ambulatory Visit: Payer: Self-pay

## 2022-11-09 ENCOUNTER — Other Ambulatory Visit: Payer: Self-pay

## 2022-11-12 ENCOUNTER — Other Ambulatory Visit: Payer: Self-pay

## 2022-11-27 ENCOUNTER — Other Ambulatory Visit: Payer: Self-pay

## 2022-12-20 ENCOUNTER — Other Ambulatory Visit: Payer: Self-pay | Admitting: Oncology

## 2022-12-20 DIAGNOSIS — Z006 Encounter for examination for normal comparison and control in clinical research program: Secondary | ICD-10-CM

## 2022-12-24 ENCOUNTER — Other Ambulatory Visit: Payer: Self-pay

## 2023-01-07 DIAGNOSIS — F331 Major depressive disorder, recurrent, moderate: Secondary | ICD-10-CM | POA: Diagnosis not present

## 2023-01-07 DIAGNOSIS — Z1389 Encounter for screening for other disorder: Secondary | ICD-10-CM | POA: Diagnosis not present

## 2023-01-07 DIAGNOSIS — R5383 Other fatigue: Secondary | ICD-10-CM | POA: Diagnosis not present

## 2023-01-07 DIAGNOSIS — E559 Vitamin D deficiency, unspecified: Secondary | ICD-10-CM | POA: Diagnosis not present

## 2023-01-07 DIAGNOSIS — Z0000 Encounter for general adult medical examination without abnormal findings: Secondary | ICD-10-CM | POA: Diagnosis not present

## 2023-01-07 DIAGNOSIS — D509 Iron deficiency anemia, unspecified: Secondary | ICD-10-CM | POA: Diagnosis not present

## 2023-01-08 ENCOUNTER — Encounter: Payer: Self-pay | Admitting: Oncology

## 2023-01-08 ENCOUNTER — Other Ambulatory Visit: Payer: Self-pay

## 2023-01-08 DIAGNOSIS — F4323 Adjustment disorder with mixed anxiety and depressed mood: Secondary | ICD-10-CM | POA: Diagnosis not present

## 2023-01-08 MED ORDER — VITAMIN D 50 MCG (2000 UT) PO TABS
2000.0000 [IU] | ORAL_TABLET | Freq: Every day | ORAL | 3 refills | Status: AC
  Filled 2023-01-08: qty 100, 100d supply, fill #0
  Filled 2023-01-08: qty 200, 200d supply, fill #0

## 2023-01-08 MED ORDER — TRAZODONE HCL 150 MG PO TABS
150.0000 mg | ORAL_TABLET | Freq: Every day | ORAL | 3 refills | Status: DC
  Filled 2023-01-08: qty 90, 90d supply, fill #0
  Filled 2023-04-04: qty 90, 90d supply, fill #1
  Filled 2023-07-06: qty 90, 90d supply, fill #2

## 2023-01-08 MED ORDER — ESCITALOPRAM OXALATE 20 MG PO TABS
20.0000 mg | ORAL_TABLET | Freq: Every day | ORAL | 6 refills | Status: DC
  Filled 2023-01-08: qty 30, 30d supply, fill #0
  Filled 2023-02-03 – 2023-02-28 (×2): qty 30, 30d supply, fill #1
  Filled 2023-04-04: qty 30, 30d supply, fill #2
  Filled 2023-05-14: qty 30, 30d supply, fill #3

## 2023-01-08 MED ORDER — VALACYCLOVIR HCL 1 G PO TABS
1000.0000 mg | ORAL_TABLET | Freq: Every day | ORAL | 5 refills | Status: DC
  Filled 2023-01-08: qty 30, 30d supply, fill #0
  Filled 2023-02-03 – 2023-02-28 (×2): qty 30, 30d supply, fill #1

## 2023-01-29 DIAGNOSIS — Z1389 Encounter for screening for other disorder: Secondary | ICD-10-CM | POA: Diagnosis not present

## 2023-01-29 DIAGNOSIS — N760 Acute vaginitis: Secondary | ICD-10-CM | POA: Diagnosis not present

## 2023-01-29 DIAGNOSIS — F4323 Adjustment disorder with mixed anxiety and depressed mood: Secondary | ICD-10-CM | POA: Diagnosis not present

## 2023-01-31 DIAGNOSIS — Z113 Encounter for screening for infections with a predominantly sexual mode of transmission: Secondary | ICD-10-CM | POA: Diagnosis not present

## 2023-02-19 ENCOUNTER — Other Ambulatory Visit: Payer: Self-pay

## 2023-02-19 DIAGNOSIS — F4323 Adjustment disorder with mixed anxiety and depressed mood: Secondary | ICD-10-CM | POA: Diagnosis not present

## 2023-02-26 DIAGNOSIS — F4323 Adjustment disorder with mixed anxiety and depressed mood: Secondary | ICD-10-CM | POA: Diagnosis not present

## 2023-03-05 DIAGNOSIS — F4323 Adjustment disorder with mixed anxiety and depressed mood: Secondary | ICD-10-CM | POA: Diagnosis not present

## 2023-03-12 DIAGNOSIS — F4323 Adjustment disorder with mixed anxiety and depressed mood: Secondary | ICD-10-CM | POA: Diagnosis not present

## 2023-03-19 DIAGNOSIS — F4323 Adjustment disorder with mixed anxiety and depressed mood: Secondary | ICD-10-CM | POA: Diagnosis not present

## 2023-04-02 DIAGNOSIS — F4323 Adjustment disorder with mixed anxiety and depressed mood: Secondary | ICD-10-CM | POA: Diagnosis not present

## 2023-04-05 ENCOUNTER — Inpatient Hospital Stay: Payer: Commercial Managed Care - PPO | Attending: Oncology

## 2023-04-05 ENCOUNTER — Telehealth: Payer: Self-pay | Admitting: Oncology

## 2023-04-05 NOTE — Telephone Encounter (Signed)
Per Lanora Manis, the mychart appt on Monday 10/28 will need to be r/s due to pt no showing labs on 10/25.    I called pt and left a vm explaining this and gave her the number to call back to r/s appts

## 2023-04-08 ENCOUNTER — Inpatient Hospital Stay: Payer: Commercial Managed Care - PPO | Admitting: Oncology

## 2023-04-08 DIAGNOSIS — F4323 Adjustment disorder with mixed anxiety and depressed mood: Secondary | ICD-10-CM | POA: Diagnosis not present

## 2023-04-09 DIAGNOSIS — F4323 Adjustment disorder with mixed anxiety and depressed mood: Secondary | ICD-10-CM | POA: Diagnosis not present

## 2023-04-18 DIAGNOSIS — F4323 Adjustment disorder with mixed anxiety and depressed mood: Secondary | ICD-10-CM | POA: Diagnosis not present

## 2023-04-23 DIAGNOSIS — F4323 Adjustment disorder with mixed anxiety and depressed mood: Secondary | ICD-10-CM | POA: Diagnosis not present

## 2023-04-30 DIAGNOSIS — F4323 Adjustment disorder with mixed anxiety and depressed mood: Secondary | ICD-10-CM | POA: Diagnosis not present

## 2023-05-02 ENCOUNTER — Other Ambulatory Visit
Admission: RE | Admit: 2023-05-02 | Discharge: 2023-05-02 | Disposition: A | Discharge status: Home / Self Care | Payer: Commercial Managed Care - PPO | Attending: Oncology | Admitting: Oncology

## 2023-05-02 DIAGNOSIS — D508 Other iron deficiency anemias: Secondary | ICD-10-CM | POA: Insufficient documentation

## 2023-05-02 LAB — IRON AND TIBC
Iron: 64 ug/dL (ref 28–170)
Saturation Ratios: 21 % (ref 10.4–31.8)
TIBC: 305 ug/dL (ref 250–450)
UIBC: 241 ug/dL

## 2023-05-02 LAB — CBC WITH DIFFERENTIAL (CANCER CENTER ONLY)
Abs Immature Granulocytes: 0.02 10*3/uL (ref 0.00–0.07)
Basophils Absolute: 0 10*3/uL (ref 0.0–0.1)
Basophils Relative: 0 %
Eosinophils Absolute: 0.1 10*3/uL (ref 0.0–0.5)
Eosinophils Relative: 1 %
HCT: 34.9 % — ABNORMAL LOW (ref 36.0–46.0)
Hemoglobin: 11.2 g/dL — ABNORMAL LOW (ref 12.0–15.0)
Immature Granulocytes: 0 %
Lymphocytes Relative: 38 %
Lymphs Abs: 2.3 10*3/uL (ref 0.7–4.0)
MCH: 25.3 pg — ABNORMAL LOW (ref 26.0–34.0)
MCHC: 32.1 g/dL (ref 30.0–36.0)
MCV: 78.8 fL — ABNORMAL LOW (ref 80.0–100.0)
Monocytes Absolute: 0.4 10*3/uL (ref 0.1–1.0)
Monocytes Relative: 7 %
Neutro Abs: 3.2 10*3/uL (ref 1.7–7.7)
Neutrophils Relative %: 54 %
Platelet Count: 234 10*3/uL (ref 150–400)
RBC: 4.43 MIL/uL (ref 3.87–5.11)
RDW: 14.4 % (ref 11.5–15.5)
WBC Count: 6 10*3/uL (ref 4.0–10.5)
nRBC: 0 % (ref 0.0–0.2)

## 2023-05-02 LAB — FERRITIN: Ferritin: 77 ng/mL (ref 11–307)

## 2023-05-14 ENCOUNTER — Other Ambulatory Visit: Payer: Self-pay

## 2023-05-14 DIAGNOSIS — F4323 Adjustment disorder with mixed anxiety and depressed mood: Secondary | ICD-10-CM | POA: Diagnosis not present

## 2023-05-16 LAB — ALPHA-THALASSEMIA GENOTYPR

## 2023-05-17 ENCOUNTER — Telehealth: Payer: Self-pay

## 2023-05-17 ENCOUNTER — Other Ambulatory Visit: Payer: Self-pay

## 2023-05-17 ENCOUNTER — Encounter: Payer: Self-pay | Admitting: Oncology

## 2023-05-17 DIAGNOSIS — D508 Other iron deficiency anemias: Secondary | ICD-10-CM

## 2023-05-17 NOTE — Telephone Encounter (Signed)
Monique Luna can you schedule patient for 1 year lab prior to virtual MD and notify patient of date and time.

## 2023-05-17 NOTE — Telephone Encounter (Signed)
Dr. Cathie Hoops any advice? Pt send this after reading your Mychart message regarding alpha thalassemia results.

## 2023-05-17 NOTE — Telephone Encounter (Signed)
-----   Message from Rickard Patience sent at 05/16/2023 10:43 PM EST ----- Please schedule 1 year lab prior to virtual MD cbc iron tibc ferritin

## 2023-05-17 NOTE — Telephone Encounter (Signed)
In addition to 1 year follow up.  Please schedule virtual visit (afternoon). Ok to schedule after [employer]

## 2023-05-24 DIAGNOSIS — F4323 Adjustment disorder with mixed anxiety and depressed mood: Secondary | ICD-10-CM | POA: Diagnosis not present

## 2023-05-30 DIAGNOSIS — F4323 Adjustment disorder with mixed anxiety and depressed mood: Secondary | ICD-10-CM | POA: Diagnosis not present

## 2023-06-06 ENCOUNTER — Other Ambulatory Visit: Payer: Commercial Managed Care - PPO | Attending: Oncology

## 2023-06-06 DIAGNOSIS — R5383 Other fatigue: Secondary | ICD-10-CM | POA: Diagnosis not present

## 2023-06-06 DIAGNOSIS — E559 Vitamin D deficiency, unspecified: Secondary | ICD-10-CM | POA: Diagnosis not present

## 2023-06-06 DIAGNOSIS — Z1389 Encounter for screening for other disorder: Secondary | ICD-10-CM | POA: Diagnosis not present

## 2023-06-06 DIAGNOSIS — Z1331 Encounter for screening for depression: Secondary | ICD-10-CM | POA: Diagnosis not present

## 2023-06-06 DIAGNOSIS — F331 Major depressive disorder, recurrent, moderate: Secondary | ICD-10-CM | POA: Diagnosis not present

## 2023-06-11 ENCOUNTER — Inpatient Hospital Stay: Payer: Commercial Managed Care - PPO | Attending: Oncology | Admitting: Oncology

## 2023-06-11 DIAGNOSIS — D563 Thalassemia minor: Secondary | ICD-10-CM

## 2023-06-11 DIAGNOSIS — D508 Other iron deficiency anemias: Secondary | ICD-10-CM | POA: Diagnosis not present

## 2023-06-11 DIAGNOSIS — D509 Iron deficiency anemia, unspecified: Secondary | ICD-10-CM | POA: Insufficient documentation

## 2023-06-12 ENCOUNTER — Encounter: Payer: Self-pay | Admitting: Oncology

## 2023-06-12 DIAGNOSIS — D563 Thalassemia minor: Secondary | ICD-10-CM | POA: Insufficient documentation

## 2023-06-12 NOTE — Assessment & Plan Note (Signed)
 Labs are reviewed and discussed with patient. Lab Results  Component Value Date   HGB 11.2 (L) 05/02/2023   TIBC 305 05/02/2023   IRONPCTSAT 21 05/02/2023   FERRITIN 77 05/02/2023      Stable hemoglobin. Iron  panel is stable. No need for IV Venofer .  Iro She may stop oral iron  supplementation.

## 2023-06-12 NOTE — Assessment & Plan Note (Signed)
 Discussed with patient that Alpha thalassemia minor is a genetic condition where a Gupta has one or two mutated genes for alpha globin, leading to mild anemia but usually no serious health issues.  Parents with alpha thalassemia minor can pass it on to their children, so I suggest that her family members undergo screening and consider genetic counseling if they are planning to start a family.

## 2023-06-12 NOTE — Progress Notes (Signed)
 HEMATOLOGY-ONCOLOGY TeleHEALTH VISIT PROGRESS NOTE  I connected with Janiyah Dickmann on 06/12/23  at  2:45 PM EST by video enabled telemedicine visit and verified that I am speaking with the correct Sokolow using two identifiers. I discussed the limitations, risks, security and privacy concerns of performing an evaluation and management service by telemedicine and the availability of in-[facility] appointments. The patient expressed understanding and agreed to proceed.   Other persons participating in the visit and their role in the encounter:  None  Patient's location: In her vehicle, not driving Provider's location: office Chief Complaint: discuss results.    INTERVAL HISTORY Payal Oguin is a 33 y.o. female who has above history reviewed by me today presents for follow up visit for management of iron  deficiency anemia.  She has no new complaints. She is now on Nuvaring.  Menstrual period is light now. She has questions of her diagnosis of alpha thalassemia minor  Review of Systems  Constitutional:  Negative for appetite change, chills, fatigue and fever.  HENT:   Negative for hearing loss and voice change.   Eyes:  Negative for eye problems.  Respiratory:  Negative for chest tightness and cough.   Cardiovascular:  Negative for chest pain.  Gastrointestinal:  Negative for abdominal distention, abdominal pain and blood in stool.  Endocrine: Negative for hot flashes.  Genitourinary:  Negative for difficulty urinating and frequency.   Musculoskeletal:  Negative for arthralgias.  Skin:  Negative for itching and rash.  Neurological:  Negative for extremity weakness.  Hematological:  Negative for adenopathy.  Psychiatric/Behavioral:  Negative for confusion.     Past Medical History:  Diagnosis Date   Urethral diverticulum    Past Surgical History:  Procedure Laterality Date   BREAST REDUCTION SURGERY Bilateral 03/24/2021   Procedure: MAMMARY REDUCTION  (BREAST);  Surgeon: Elisabeth Craig RAMAN, MD;  Location: [facility];  Service: Plastics;  Laterality: Bilateral;   CHOLECYSTECTOMY  2009   [facility]   TRANSTHORACIC ECHOCARDIOGRAM  11/21/2021   Technically difficult-poor echo windows.  Suboptimal apical and subcostal windows.  Definity contrast not used.  EF estimated at 60 to 65% no RWMA.  Normal diastolic pressures.  Normal RV size and function.  Only mild MR with no MVP.  Normal AOV.  Normal RAP.    Family History  Problem Relation Age of Onset   Hypertension Mother    Hypertension Father    Healthy Sister    Healthy Sister    Diabetes Maternal Grandmother    Migraines Maternal Grandmother    Heart disease Maternal Grandmother    Depression Maternal Grandmother    Anemia Maternal Grandmother     Social History   Socioeconomic History   Marital status: Single    Spouse name: Not on file   Number of children: Not on file   Years of education: Not on file   Highest education level: Not on file  Occupational History   Not on file  Tobacco Use   Smoking status: Never   Smokeless tobacco: Never  Vaping Use   Vaping status: Never Used  Substance and Sexual Activity   Alcohol use: Not Currently    Comment: Rarely   Drug use: No   Sexual activity: Yes    Birth control/protection: I.U.D.  Other Topics Concern   Not on file  Social History Narrative   Not on file   Social Drivers of Health   Financial Resource Strain: Not on file  Food Insecurity: Not on  file  Transportation Needs: Not on file  Physical Activity: Not on file  Stress: Not on file  Social Connections: Not on file  Intimate Partner Violence: Not on file    Current Outpatient Medications on File Prior to Visit  Medication Sig Dispense Refill   Cholecalciferol  (VITAMIN D ) 50 MCG (2000 UT) tablet Take 1 tablet (2,000 Units total) by mouth daily for vitamin D  deficiency 100 tablet 3   escitalopram  (LEXAPRO ) 10 MG tablet Take 10 mg by mouth daily.     escitalopram   (LEXAPRO ) 10 MG tablet Take 1 tablet (10 mg total) by mouth once daily. 30 tablet 10   etonogestrel -ethinyl estradiol  (NUVARING) 0.12-0.015 MG/24HR vaginal ring Place 1 ring vaginally for 3 weeks, then remove for 1 week. 3 each 2   Iron -Vitamin C  65-125 MG TABS Take 1 tablet by mouth daily. 90 tablet 1   traZODone  (DESYREL ) 150 MG tablet Take 1 tablet (150 mg total) by mouth daily sleep. 90 tablet 3   valACYclovir  (VALTREX ) 1000 MG tablet Take by mouth as needed.     valACYclovir  (VALTREX ) 1000 MG tablet Take 1 tablet (1,000 mg total) by mouth daily. Cant take 1/2 pill daily for suppression after flare resolves 30 tablet 5   buPROPion  (WELLBUTRIN  XL) 150 MG 24 hr tablet Take 1 tablet (150 mg total) by mouth 2 (two) times daily. (Patient not taking: Reported on 06/11/2023) 60 tablet 2   escitalopram  (LEXAPRO ) 20 MG tablet Take 1 tablet (20 mg total) by mouth daily. (Patient not taking: Reported on 06/11/2023) 30 tablet 6   meloxicam (MOBIC) 15 MG tablet Take 15 mg by mouth 3 (three) times daily. (Patient not taking: Reported on 10/05/2022)     SODIUM FLUORIDE 5000 SENSITIVE 1.1-5 % GEL Take by mouth. (Patient not taking: Reported on 10/05/2022)     traZODone  (DESYREL ) 50 MG tablet Take by mouth. (Patient not taking: Reported on 06/11/2023)     traZODone  (DESYREL ) 50 MG tablet Take 1 tablet (50 mg total) by mouth at bedtime as needed for sleep and mood. May increase to 2 tablets if needed. (Patient not taking: Reported on 06/11/2023) 60 tablet 10   traZODone  (DESYREL ) 50 MG tablet Take 1 tablet (50 mg total) by mouth at bedtime for sleep and mood. May increase to 2 tablets if needed. (Patient not taking: Reported on 06/11/2023) 30 tablet 10   valACYclovir  (VALTREX ) 1000 MG tablet Take 1 tablet (1,000 mg total) by mouth once daily. Can take 1/2 tablet daily for suppression after flare resolves. (Patient not taking: Reported on 10/05/2022) 5 tablet 5   No current facility-administered medications on file  prior to visit.    No Known Allergies     Observations/Objective: Today's Vitals   06/11/23 1438  PainSc: 0-No pain   There is no height or weight on file to calculate BMI.  Physical Exam Neurological:     Mental Status: She is alert.      Labs    Latest Ref Rng & Units 05/02/2023   11:26 AM 09/28/2022    2:12 PM 03/26/2022   12:11 PM  CBC  WBC 4.0 - 10.5 K/uL 6.0  7.0  6.4   Hemoglobin 12.0 - 15.0 g/dL 88.7  87.7  88.2   Hematocrit 36.0 - 46.0 % 34.9  39.1  37.5   Platelets 150 - 400 K/uL 234  261  233       Latest Ref Rng & Units 02/22/2022   10:02 PM 12/27/2021  4:06 PM  CMP  Glucose 70 - 99 mg/dL 891  94   BUN 6 - 20 mg/dL 15  10   Creatinine 9.55 - 1.00 mg/dL 8.98  9.19   Sodium 864 - 145 mmol/L 141  137   Potassium 3.5 - 5.1 mmol/L 3.6  3.9   Chloride 98 - 111 mmol/L 104  105   CO2 22 - 32 mmol/L 28  29   Calcium 8.9 - 10.3 mg/dL 9.1  8.7   Total Protein 6.5 - 8.1 g/dL  7.6   Total Bilirubin 0.3 - 1.2 mg/dL  0.4   Alkaline Phos 38 - 126 U/L  81   AST 15 - 41 U/L  15   ALT 0 - 44 U/L  11    Lab Results  Component Value Date   IRON  64 05/02/2023   TIBC 305 05/02/2023   FERRITIN 77 05/02/2023   ASSESSMENT & PLAN:   IDA (iron  deficiency anemia) Labs are reviewed and discussed with patient. Lab Results  Component Value Date   HGB 11.2 (L) 05/02/2023   TIBC 305 05/02/2023   IRONPCTSAT 21 05/02/2023   FERRITIN 77 05/02/2023      Stable hemoglobin. Iron  panel is stable. No need for IV Venofer .  Iro She may stop oral iron  supplementation.    Alpha thalassemia minor Discussed with patient that Alpha thalassemia minor is a genetic condition where a Aguillard has one or two mutated genes for alpha globin, leading to mild anemia but usually no serious health issues.  Parents with alpha thalassemia minor can pass it on to their children, so I suggest that her family members undergo screening and consider genetic counseling if they are planning to start a  family.  Follow up PRN All questions were answered. The patient knows to call the clinic with any problems, questions or concerns.  Zelphia Cap, MD, PhD [facility] Hematology Oncology 06/11/2023

## 2023-06-13 DIAGNOSIS — F4323 Adjustment disorder with mixed anxiety and depressed mood: Secondary | ICD-10-CM | POA: Diagnosis not present

## 2023-06-14 ENCOUNTER — Encounter: Payer: Self-pay | Admitting: Oncology

## 2023-06-14 NOTE — Progress Notes (Signed)
 Sleep Medicine   Office Visit  Patient Name: Monique Luna DOB: [birth year] MRN [id number]    Chief Complaint: fatigue  Brief History:  [name] presents for an initial consult for sleep evaluation and to establish care. Patient has at least over 1 year history of excessive fatigue. Sleep quality is poor. This is noted every night. The patient's bed partner reports  snoring and gasping at night. The patient relates the following symptoms: excessive fatigue, excessive daytime sleepiness, memory issues, brain fogginess and trouble concentrating are also present. The patient goes to sleep at 0830 pm and wakes up at 0630 am and will wake up several times in between with trouble returning to sleep.  Sleep quality is the same when outside home environment.  Patient has noted significant movement of her legs at night that would disrupt her sleep.  The patient  relates no unusual behavior during the night.  The patient relates  anxiety and depression as a history of psychiatric problems. The Epworth Sleepiness Score is 6 out of 24 .  The patient relates  Cardiovascular risk factors include: none.  The patient reports taking trazodone  at night to help with sleep. She is also working with her psychiatrist to make medication changes for her depression.     ROS  General: (-) fever, (-) chills, (-) night sweat Nose and Sinuses: (-) nasal stuffiness or itchiness, (-) postnasal drip, (-) nosebleeds, (-) sinus trouble. Mouth and Throat: (-) sore throat, (-) hoarseness. Neck: (-) swollen glands, (-) enlarged thyroid , (-) neck pain. Respiratory: + cough, - shortness of breath, - wheezing. Neurologic: - numbness, - tingling. Psychiatric: + anxiety, + depression Sleep behavior: -sleep paralysis -hypnogogic hallucinations -dream enactment      -vivid dreams -cataplexy -night terrors -sleep walking   Current Medication: Outpatient Encounter Medications as of 06/17/2023  Medication Sig   Cholecalciferol   (VITAMIN D ) 50 MCG (2000 UT) tablet Take 1 tablet (2,000 Units total) by mouth daily for vitamin D  deficiency   escitalopram  (LEXAPRO ) 10 MG tablet Take 10 mg by mouth daily.   traZODone  (DESYREL ) 150 MG tablet Take 1 tablet (150 mg total) by mouth daily sleep.   [DISCONTINUED] buPROPion  (WELLBUTRIN  XL) 150 MG 24 hr tablet Take 1 tablet (150 mg total) by mouth 2 (two) times daily. (Patient not taking: Reported on 06/11/2023)   [DISCONTINUED] escitalopram  (LEXAPRO ) 10 MG tablet Take 1 tablet (10 mg total) by mouth once daily.   [DISCONTINUED] escitalopram  (LEXAPRO ) 20 MG tablet Take 1 tablet (20 mg total) by mouth daily. (Patient not taking: Reported on 06/11/2023)   [DISCONTINUED] etonogestrel -ethinyl estradiol  (NUVARING) 0.12-0.015 MG/24HR vaginal ring Place 1 ring vaginally for 3 weeks, then remove for 1 week.   [DISCONTINUED] Iron -Vitamin C  65-125 MG TABS Take 1 tablet by mouth daily.   [DISCONTINUED] meloxicam (MOBIC) 15 MG tablet Take 15 mg by mouth 3 (three) times daily. (Patient not taking: Reported on 10/05/2022)   [DISCONTINUED] SODIUM FLUORIDE 5000 SENSITIVE 1.1-5 % GEL Take by mouth. (Patient not taking: Reported on 10/05/2022)   [DISCONTINUED] traZODone  (DESYREL ) 50 MG tablet Take by mouth. (Patient not taking: Reported on 06/11/2023)   [DISCONTINUED] traZODone  (DESYREL ) 50 MG tablet Take 1 tablet (50 mg total) by mouth at bedtime as needed for sleep and mood. May increase to 2 tablets if needed. (Patient not taking: Reported on 06/11/2023)   [DISCONTINUED] traZODone  (DESYREL ) 50 MG tablet Take 1 tablet (50 mg total) by mouth at bedtime for sleep and mood. May increase to 2 tablets if needed. (Patient  not taking: Reported on 06/11/2023)   [DISCONTINUED] valACYclovir  (VALTREX ) 1000 MG tablet Take by mouth as needed.   [DISCONTINUED] valACYclovir  (VALTREX ) 1000 MG tablet Take 1 tablet (1,000 mg total) by mouth once daily. Can take 1/2 tablet daily for suppression after flare resolves. (Patient  not taking: Reported on 10/05/2022)   [DISCONTINUED] valACYclovir  (VALTREX ) 1000 MG tablet Take 1 tablet (1,000 mg total) by mouth daily. Cant take 1/2 pill daily for suppression after flare resolves   No facility-administered encounter medications on file as of 06/17/2023.    Surgical History: Past Surgical History:  Procedure Laterality Date   BREAST REDUCTION SURGERY Bilateral 03/24/2021   Procedure: MAMMARY REDUCTION  (BREAST);  Surgeon: Elisabeth Craig RAMAN, MD;  Location: [facility];  Service: Plastics;  Laterality: Bilateral;   CHOLECYSTECTOMY  2009   [facility]   TRANSTHORACIC ECHOCARDIOGRAM  11/21/2021   Technically difficult-poor echo windows.  Suboptimal apical and subcostal windows.  Definity contrast not used.  EF estimated at 60 to 65% no RWMA.  Normal diastolic pressures.  Normal RV size and function.  Only mild MR with no MVP.  Normal AOV.  Normal RAP.    Medical History: Past Medical History:  Diagnosis Date   Urethral diverticulum     Family History: Non contributory to the present illness  Social History: Social History   Socioeconomic History   Marital status: Single    Spouse name: Not on file   Number of children: Not on file   Years of education: Not on file   Highest education level: Not on file  Occupational History   Not on file  Tobacco Use   Smoking status: Never   Smokeless tobacco: Never  Vaping Use   Vaping status: Never Used  Substance and Sexual Activity   Alcohol use: Not Currently    Comment: Rarely   Drug use: No   Sexual activity: Yes    Birth control/protection: I.U.D.  Other Topics Concern   Not on file  Social History Narrative   Not on file   Social Drivers of Health   Financial Resource Strain: Not on file  Food Insecurity: Not on file  Transportation Needs: Not on file  Physical Activity: Not on file  Stress: Not on file  Social Connections: Not on file  Intimate Partner Violence: Not on file     Vital Signs: Blood pressure 109/81, pulse 96, resp. rate 16, height 5' 5 (1.651 m), weight 201 lb (91.2 kg), SpO2 99%, unknown if currently breastfeeding. Body mass index is 33.45 kg/m.   Examination: General Appearance: The patient is well-developed, well-nourished, and in no distress. Neck Circumference: 37 cm Skin: Gross inspection of skin unremarkable. Head: normocephalic, no gross deformities. Eyes: no gross deformities noted. ENT: ears appear grossly normal Neurologic: Alert and oriented. No involuntary movements.    STOP BANG RISK ASSESSMENT S (snore) Have you been told that you snore?     YES   T (tired) Are you often tired, fatigued, or sleepy during the day?   YES  O (obstruction) Do you stop breathing, choke, or gasp during sleep? YES   P (pressure) Do you have or are you being treated for high blood pressure? NO   B (BMI) Is your body index greater than 35 kg/m? NO   A (age) Are you 53 years old or older? NO   N (neck) Do you have a neck circumference greater than 16 inches?   NO   G (gender) Are  you a female? NO   TOTAL STOP/BANG "YES" ANSWERS 3                                                               A STOP-Bang score of 2 or less is considered low risk, and a score of 5 or more is high risk for having either moderate or severe OSA. For people who score 3 or 4, doctors may need to perform further assessment to determine how likely they are to have OSA.         EPWORTH SLEEPINESS SCALE:  Scale:  (0)= no chance of dozing; (1)= slight chance of dozing; (2)= moderate chance of dozing; (3)= high chance of dozing  Chance  Situtation    Sitting and reading: 0    Watching TV: 3    Sitting Inactive in public: 0    As a passenger in car: 0      Lying down to rest: 3    Sitting and talking: 0    Sitting quielty after lunch: 0    In a car, stopped in traffic: 0   TOTAL SCORE:   6 out of 24    SLEEP STUDIES:  None   LABS: Recent  Results (from the past 2160 hours)  Ferritin     Status: None   Collection Time: 05/02/23 11:26 AM  Result Value Ref Range   Ferritin 77 11 - 307 ng/mL    Comment: Performed at [facility], [address] [place], [place] [address]  Iron  and TIBC     Status: None   Collection Time: 05/02/23 11:26 AM  Result Value Ref Range   Iron  64 28 - 170 ug/dL   TIBC 694 749 - 549 ug/dL   Saturation Ratios 21 10.4 - 31.8 %   UIBC 241 ug/dL    Comment: Performed at [facility], [address] [place], [place] [address]  CBC with Differential (Cancer Center Only)     Status: Abnormal   Collection Time: 05/02/23 11:26 AM  Result Value Ref Range   WBC Count 6.0 4.0 - 10.5 K/uL   RBC 4.43 3.87 - 5.11 MIL/uL   Hemoglobin 11.2 (L) 12.0 - 15.0 g/dL   HCT 65.0 (L) 63.9 - 53.9 %   MCV 78.8 (L) 80.0 - 100.0 fL   MCH 25.3 (L) 26.0 - 34.0 pg   MCHC 32.1 30.0 - 36.0 g/dL   RDW 85.5 88.4 - 84.4 %   Platelet Count 234 150 - 400 K/uL   nRBC 0.0 0.0 - 0.2 %   Neutrophils Relative % 54 %   Neutro Abs 3.2 1.7 - 7.7 K/uL   Lymphocytes Relative 38 %   Lymphs Abs 2.3 0.7 - 4.0 K/uL   Monocytes Relative 7 %   Monocytes Absolute 0.4 0.1 - 1.0 K/uL   Eosinophils Relative 1 %   Eosinophils Absolute 0.1 0.0 - 0.5 K/uL   Basophils Relative 0 %   Basophils Absolute 0.0 0.0 - 0.1 K/uL   Immature Granulocytes 0 %   Abs Immature Granulocytes 0.02 0.00 - 0.07 K/uL    Comment: Performed at [facility], [address], [place], [place] [address]  Alpha-Thalassemia GenotypR     Status: None   Collection Time:  05/02/23 11:26 AM  Result Value Ref Range   Alpha-Thalassemia Comment:     Comment: (NOTE) Test: Alpha-Thalassemia, DNA Analysis Result:     Alpha-thalassemia minor, alpha-/alpha- Mutation(s) identified:  alpha3.7 and alpha3.7 Interpretation: This result is most consistent with this individual being an unaffected carrier of alpha-thalassemia with two of the  four alpha-globin genes deleted (Alpha-thalassemia minor). The two remaining alpha-globin genes are arranged in trans, one copy located on each chromosome 16 homolog. Individuals with alpha-thalassemia minor can demonstrate microcytosis, hypocromia, and normal levels of HbA2 and HbF. However, they typically have no significant clinical findings. Additionally, this individual is negative for the Hemoglobin Constant Spring mutation. The presence of other non-deletional mutations, like point mutations, in the alpha-globin gene cannot be excluded as the assay does not detect these changes.  Co-inheritance of alpha-thalassemia and other hemoglobinopathies, such as beta-thalassemia and sickle cell dis ease is possible.  Genetic counseling and hemoglobinopathy testing are recommended for this individual's reproductive partner and family members. Alpha-thalassemia is an autosomal recessive condition that results from a deletion or dysfunction of alpha-globin chains. There are four genes that make the alpha-globin chain, which binds with the beta-globin chain to create the alpha alpha/beta beta hemoglobin tetramer. An imbalance of alpha and beta chains can lead to disease, ranging from mild anemia to fatal hydrops fetalis. Hemoglobin Constant Spring is a non-deletional alpha-thalassemia mutation that is more common in [place]. Hemoglobin Constant Spring is caused by a mutation in the stop codon of the alpha(2)-globin gene that results in poor alpha-globin production. The assay detects over 90% of alpha-globin deletions depending on an individual's ethnic background. The analysis does not detect mutations in the beta-globin gene, such as beta-thalassemia or sick le cell disease. Diagnosis of alpha-thalassemia should not rely on DNA testing alone, but should take into consideration clinical symptoms and other test results, such as hemoglobin fractionation testing. Genetic counselors  are available for health care providers to discuss this result further at [phone]. Methodology: DNA analysis of the alpha-globin locus (HBA1/HBA2, OMIM 141800 / 141850, 16pter-16p13.3) is performed by multiplex ligation-dependent probe amplification (MLPA). This methodology detects copy number changes by targeting 24 different sequences in the alpha-globin region, and will, in principle, detect all genomic deletions and duplications involving this locus, as well as the Constant Spring point mutation. DNA analysis of the alpha-globin gene deletions performed by multiplex polymerase chain reaction (PCR) followed by agarose gel electrophoresis may on occasion be used for confirmatory purpose. The mutations analyzed in this manner are alpha3.7,  alpha4.2, Southeast Asian type (--SEA), Filipino type (--FIL), [place] type(--THAI), Mediterranean (--MED) and alpha (20.5). The analysis does not detect other point mutations within this region, nor does it detect mutations of the beta-globin gene, that underlie beta-thalassemia or sickle cell disease. Results of this test are for investigational and research purposes only per the assay's manufacturer. The performance characteristics of this assay have not been established by the manufacturer. The result should not be used for treatment or for diagnostic purposes without confirmation of the diagnosis by another medically established diagnostic product or procedure. The performance characteristics were determined by [facility]. The presence of a rare mutation cannot be entirely ruled out. Molecular-based testing is highly accurate, but as in any laboratory test, rare diagnostic error may occur. References: 1. Chong SS et al. Laurie multiplex-PCR screen for   commo n deletional determinants of alpha-thalassemia.   2000.  Blood 95:360-362. 2. Windsor APPLETHWAITE et al. Simplified Multiplex-PCR diagnosis of   common Southeast Asian deletional  determinants of alpha-   thalassemia. 2000. Clin Chem 46(10):1692-5. 3. Schrier SL et al. The unusal pathobiology of Hemoglobin   Constant Spring red blood cells.   1977. Blood 89:1762-1769. 4. Singer ST et al. Hemoglobin H-Constant Spring in [place]: an alpha thalassemia with frequent complications   . 2009. Am J Hematology [phone]. 5. Uaprasert N et al. Hematological characteristics and   effective screening for compound heterozygosity for Hb   Constant Spring and deletional alpha-+-thalassemia.   2011. Am J Hematology [phone]. 6. Galanello R and Cao A. Alpha-Thalassemia. 2011.   GeneReviews. www.genetests.org 7. [place]   [facility]  [facility]   [email or web address] as cited on   07/08/2003. 8. Arminda LIMA, Han H, et al. Detection of alpha-th alassemia in   [place] by using multiplex ligation-dependent probe   amplification. 2008. Hemoglobin 32:561-571. 9. [facility] SALSA MLPA P140-B2 HBA kit RUO package insert   version 16. Results Released By: W. Wanda Norse, PhD, FACMG [facility], [address] [place] [address] Report Released By: Greig MOTE Dexter, MS, CGC, Genetic Counselor Results for this test are for research purposes only by the assay's manufacturer.  The performance characteristics of this product have not been established.  Results should not be used as a diagnostic procedure without confirmation of the diagnosis by another medically established diagnostic product or procedure. Performed At: [facility] [address] [place], [place] [id number] Loran Gales MDPhD Ey:[phone]     Radiology: No results found.  No results found.  No results found.    Assessment and Plan: Patient Active Problem List   Diagnosis Date Noted   Alpha thalassemia minor 06/12/2023   Major depressive disorder, single episode, unspecified 02/22/2022   Localized swelling, mass and lump, unspecified lower limb  02/22/2022   Anxiety disorder, unspecified 02/22/2022   IDA (iron  deficiency anemia) 12/27/2021   Systolic click-murmur syndrome 10/20/2021   Rapid palpitations 10/20/2021   1. Hypersomnia (Primary) Patient evaluation suggests high risk of sleep disordered breathing due to snoring, gasping, daytime sleepiness, brain fog, difficulty concentration.  Suggest: PSG  to assess/treat the patient's sleep disordered breathing. The patient was also counselled on weight loss to optimize sleep health.  2. Obesity (BMI 30-39.9) Obesity Counseling: Had a lengthy discussion regarding patients BMI and weight issues. Patient was instructed on portion control as well as increased activity. Also discussed caloric restrictions with trying to maintain intake less than 2000 Kcal. Discussions were made in accordance with the 5As of weight management. Simple actions such as not eating late and if able to, taking a walk is suggested.     General Counseling: I have discussed the findings of the evaluation and examination with Brandan.  I have also discussed any further diagnostic evaluation thatmay be needed or ordered today. Lakrisha verbalizes understanding of the findings of todays visit. We also reviewed her medications today and discussed drug interactions and side effects including but not limited excessive drowsiness and altered mental states. We also discussed that there is always a risk not just to her but also people around her. she has been encouraged to call the office with any questions or concerns that should arise related to todays visit.  No orders of the defined types were placed in this encounter.       I have personally obtained a history, evaluated the patient, evaluated pertinent data, formulated the assessment and plan and placed orders.   /This patient was seen today by Lauraine Lay, PA-C in collaboration  with Dr. Elfreda Bathe.   Elfreda DELENA Bathe, MD [facility] Diplomate ABMS Pulmonary and Critical  Care Medicine Sleep medicine

## 2023-06-17 ENCOUNTER — Ambulatory Visit (INDEPENDENT_AMBULATORY_CARE_PROVIDER_SITE_OTHER): Payer: Commercial Managed Care - PPO | Admitting: Internal Medicine

## 2023-06-17 VITALS — BP 109/81 | HR 96 | Resp 16 | Ht 65.0 in | Wt 201.0 lb

## 2023-06-17 DIAGNOSIS — G4710 Hypersomnia, unspecified: Secondary | ICD-10-CM | POA: Diagnosis not present

## 2023-06-17 DIAGNOSIS — E669 Obesity, unspecified: Secondary | ICD-10-CM | POA: Diagnosis not present

## 2023-06-27 ENCOUNTER — Other Ambulatory Visit: Payer: Self-pay

## 2023-06-27 MED ORDER — DESVENLAFAXINE SUCCINATE ER 50 MG PO TB24
50.0000 mg | ORAL_TABLET | Freq: Every day | ORAL | 3 refills | Status: DC
  Filled 2023-06-27 – 2023-07-06 (×2): qty 30, 30d supply, fill #0
  Filled 2023-08-13: qty 30, 30d supply, fill #1
  Filled 2023-09-14: qty 30, 30d supply, fill #2
  Filled 2023-10-22: qty 30, 30d supply, fill #3

## 2023-07-07 ENCOUNTER — Other Ambulatory Visit: Payer: Self-pay

## 2023-07-11 ENCOUNTER — Encounter (INDEPENDENT_AMBULATORY_CARE_PROVIDER_SITE_OTHER): Payer: Commercial Managed Care - PPO | Admitting: Internal Medicine

## 2023-07-11 DIAGNOSIS — G4733 Obstructive sleep apnea (adult) (pediatric): Secondary | ICD-10-CM | POA: Diagnosis not present

## 2023-07-11 DIAGNOSIS — G4719 Other hypersomnia: Secondary | ICD-10-CM

## 2023-07-15 DIAGNOSIS — F4323 Adjustment disorder with mixed anxiety and depressed mood: Secondary | ICD-10-CM | POA: Diagnosis not present

## 2023-07-26 DIAGNOSIS — F4323 Adjustment disorder with mixed anxiety and depressed mood: Secondary | ICD-10-CM | POA: Diagnosis not present

## 2023-08-13 DIAGNOSIS — F4323 Adjustment disorder with mixed anxiety and depressed mood: Secondary | ICD-10-CM | POA: Diagnosis not present

## 2023-08-15 ENCOUNTER — Other Ambulatory Visit: Payer: Self-pay

## 2023-08-15 MED ORDER — DESVENLAFAXINE ER 50 MG PO TB24
50.0000 mg | ORAL_TABLET | Freq: Every day | ORAL | 3 refills | Status: DC
  Filled 2023-08-15: qty 90, 90d supply, fill #0

## 2023-08-22 DIAGNOSIS — F4323 Adjustment disorder with mixed anxiety and depressed mood: Secondary | ICD-10-CM | POA: Diagnosis not present

## 2023-08-30 DIAGNOSIS — F4323 Adjustment disorder with mixed anxiety and depressed mood: Secondary | ICD-10-CM | POA: Diagnosis not present

## 2023-09-05 DIAGNOSIS — F4323 Adjustment disorder with mixed anxiety and depressed mood: Secondary | ICD-10-CM | POA: Diagnosis not present

## 2023-09-13 DIAGNOSIS — F4323 Adjustment disorder with mixed anxiety and depressed mood: Secondary | ICD-10-CM | POA: Diagnosis not present

## 2023-09-25 ENCOUNTER — Other Ambulatory Visit: Payer: Self-pay

## 2023-09-25 MED ORDER — VALACYCLOVIR HCL 1 G PO TABS
1000.0000 mg | ORAL_TABLET | Freq: Every day | ORAL | 5 refills | Status: AC
  Filled 2023-09-25: qty 30, 30d supply, fill #0
  Filled 2024-01-18 – 2024-05-19 (×2): qty 30, 30d supply, fill #1

## 2023-09-27 ENCOUNTER — Encounter

## 2023-09-27 ENCOUNTER — Telehealth: Admitting: Physician Assistant

## 2023-09-27 DIAGNOSIS — R3989 Other symptoms and signs involving the genitourinary system: Secondary | ICD-10-CM | POA: Diagnosis not present

## 2023-09-27 MED ORDER — NITROFURANTOIN MONOHYD MACRO 100 MG PO CAPS: 100.0000 mg | ORAL_CAPSULE | Freq: Two times a day (BID) | ORAL | 0 refills | Status: DC

## 2023-09-27 NOTE — Patient Instructions (Signed)
 Monique Luna, thank you for joining Angelia Kelp, PA-C for today's virtual visit.  While this provider is not your primary care provider (PCP), if your PCP is located in our provider database this encounter information will be shared with them immediately following your visit.   A [facility] MyChart account gives you access to today's visit and all your visits, tests, and labs performed at [facility] " click here if you don't have a [facility] MyChart account or go to [email or web address]  Consent: (Patient) Monique Luna provided verbal consent for this virtual visit at the beginning of the encounter.  Current Medications:  Current Outpatient Medications:    nitrofurantoin , macrocrystal-monohydrate, (MACROBID ) 100 MG capsule, Take 1 capsule (100 mg total) by mouth 2 (two) times daily., Disp: 10 capsule, Rfl: 0   Cholecalciferol  (VITAMIN D ) 50 MCG (2000 UT) tablet, Take 1 tablet (2,000 Units total) by mouth daily for vitamin D  deficiency, Disp: 100 tablet, Rfl: 3   desvenlafaxine  (PRISTIQ ) 50 MG 24 hr tablet, Take 1 tablet (50 mg total) by mouth daily., Disp: 30 tablet, Rfl: 3   Desvenlafaxine  ER (PRISTIQ ) 50 MG TB24, Take 1 tablet (50 mg total) by mouth daily for mood., Disp: 90 tablet, Rfl: 3   escitalopram  (LEXAPRO ) 10 MG tablet, Take 10 mg by mouth daily., Disp: , Rfl:    traZODone  (DESYREL ) 150 MG tablet, Take 1 tablet (150 mg total) by mouth daily sleep., Disp: 90 tablet, Rfl: 3   valACYclovir  (VALTREX ) 1000 MG tablet, Take 1 tablet (1,000 mg total) by mouth daily. Can take 1/2 pill daily for suppression after flare resolves, Disp: 30 tablet, Rfl: 5   Medications ordered in this encounter:  Meds ordered this encounter  Medications   nitrofurantoin , macrocrystal-monohydrate, (MACROBID ) 100 MG capsule    Sig: Take 1 capsule (100 mg total) by mouth 2 (two) times daily.    Dispense:  10 capsule    Refill:  0    Supervising Provider:   Corine Dice  [[id number]]     *If you need refills on other medications prior to your next appointment, please contact your pharmacy*  Follow-Up: Call back or seek an in-[place] evaluation if the symptoms worsen or if the condition fails to improve as anticipated.  [facility] [phone]  Other Instructions Urinary Tract Infection, Female A urinary tract infection (UTI) is an infection in your urinary tract. The urinary tract is made up of organs that make, store, and get rid of pee (urine) in your body. These organs include: The kidneys. The ureters. The bladder. The urethra. What are the causes? Most UTIs are caused by germs called bacteria. They may be in or near your genitals. These germs grow and cause swelling in your urinary tract. What increases the risk? You're more likely to get a UTI if: You're a female. The urethra is shorter in females than in males. You have a soft tube called a catheter that drains your pee. You can't control when you pee or poop. You have trouble peeing because of: A kidney stone. A urinary blockage. A nerve condition that affects your bladder. Not getting enough to drink. You're sexually active. You use a birth control inside your vagina, like spermicide. You're pregnant. You have low levels of the hormone estrogen in your body. You're an older adult. You're also more likely to get a UTI if you have other health problems. These may include: Diabetes. A weak immune system. Your immune system is your body's  defense system. Sickle cell disease. Injury of the spine. What are the signs or symptoms? Symptoms may include: Needing to pee right away. Peeing small amounts often. Pain or burning when you pee. Blood in your pee. Pee that smells bad or odd. Pain in your belly or lower back. You may also: Feel confused. This may be the first symptom in older adults. Vomit. Not feel hungry. Feel tired or easily annoyed. Have a fever or  chills. How is this diagnosed? A UTI is diagnosed based on your medical history and an exam. You may also have other tests. These may include: Pee tests. Blood tests. Tests for sexually transmitted infections (STIs). If you've had more than one UTI, you may need to have imaging studies done to find out why you keep getting them. How is this treated? A UTI can be treated by: Taking antibiotics or other medicines. Drinking enough fluid to keep your pee pale yellow. In rare cases, a UTI can cause a very bad condition called sepsis. Sepsis may be treated in the hospital. Follow these instructions at home: Medicines Take your medicines only as told by your health care provider. If you were given antibiotics, take them as told by your provider. Do not stop taking them even if you start to feel better. General instructions Make sure you: Pee often and fully. Do not hold your pee for a long time. Wipe from front to back after you pee or poop. Use each tissue only once when you wipe. Pee after you have sex. Do not douche or use sprays or powders in your genital area. Contact a health care provider if: Your symptoms don't get better after 1-2 days of taking antibiotics. Your symptoms go away and then come back. You have a fever or chills. You vomit or feel like you may vomit. Get help right away if: You have very bad pain in your back or lower belly. You faint. This information is not intended to replace advice given to you by your health care provider. Make sure you discuss any questions you have with your health care provider. Document Revised: 01/03/2023 Document Reviewed: 08/31/2022 Elsevier Patient Education  2024 Elsevier Inc.   If you have been instructed to have an in-[place] evaluation today at a local Urgent Care facility, please use the link below. It will take you to a list of all of our available [facility], including address, phone number and hours of operation.  Please do not delay care.  [facility]  If you or a family member do not have a primary care provider, use the link below to schedule a visit and establish care. When you choose a [facility] primary care physician or advanced practice provider, you gain a long-term partner in health. Find a Primary Care Provider  Learn more about [facility]'s in-office and virtual care options: [facility] - Get Care Now

## 2023-09-27 NOTE — Progress Notes (Signed)
 Virtual Visit Consent   Monique Luna, you are scheduled for a virtual visit with a [facility] provider today. Just as with appointments in the office, your consent must be obtained to participate. Your consent will be active for this visit and any virtual visit you may have with one of our providers in the next 365 days. If you have a MyChart account, a copy of this consent can be sent to you electronically.  As this is a virtual visit, video technology does not allow for your provider to perform a traditional examination. This may limit your provider's ability to fully assess your condition. If your provider identifies any concerns that need to be evaluated in Monique Luna or the need to arrange testing (such as labs, EKG, etc.), we will make arrangements to do so. Although advances in technology are sophisticated, we cannot ensure that it will always work on either your end or our end. If the connection with a video visit is poor, the visit may have to be switched to a telephone visit. With either a video or telephone visit, we are not always able to ensure that we have a secure connection.  By engaging in this virtual visit, you consent to the provision of healthcare and authorize for your insurance to be billed (if applicable) for the services provided during this visit. Depending on your insurance coverage, you may receive a charge related to this service.  I need to obtain your verbal consent now. Are you willing to proceed with your visit today? Monique Luna has provided verbal consent on 09/27/2023 for a virtual visit (video or telephone). Angelia Kelp, PA-C  Date: 09/27/2023 4:51 PM   Virtual Visit via Video Note   I, Angelia Kelp, connected with  Monique Luna  ([id number], [birth year]) on 09/27/23 at  4:45 PM EDT by a video-enabled telemedicine application and verified that I am speaking with the correct Tr[facility] using two identifiers.  Location: Patient: Virtual Visit  Location Patient: Mobile Provider: Virtual Visit Location Provider: Home Office   I discussed the limitations of evaluation and management by telemedicine and the availability of in Monique Luna appointments. The patient expressed understanding and agreed to proceed.    History of Present Illness: Monique Luna is a 33 y.o. who identifies as a female who was assigned female at birth, and is being seen today for dysuria.  HPI: Urinary Tract Infection  This is a new problem. The current episode started in the past 7 days (over the last 4 days). The problem occurs every urination. The problem has been gradually worsening. The quality of the pain is described as burning. The pain is mild. There has been no fever. Associated symptoms include frequency, hesitancy and urgency. Pertinent negatives include no chills, discharge, flank pain, hematuria or nausea. Associated symptoms comments: Strong odor. She has tried increased fluids for the symptoms. The treatment provided no relief.   UA at work positive for leuks.   Problems:  Patient Active Problem List   Diagnosis Date Noted   Hypersomnia 06/17/2023   Obesity (BMI 30-39.9) 06/17/2023   Alpha thalassemia minor 06/12/2023   Major depressive disorder, single episode, unspecified 02/22/2022   Localized swelling, mass and lump, unspecified lower limb 02/22/2022   Anxiety disorder, unspecified 02/22/2022   IDA (iron  deficiency anemia) 12/27/2021   Systolic click-murmur syndrome 10/20/2021   Rapid palpitations 10/20/2021    Allergies: No Known Allergies Medications:  Current Outpatient Medications:    nitrofurantoin , macrocrystal-monohydrate, (MACROBID ) 100 MG capsule, Take 1  capsule (100 mg total) by mouth 2 (two) times daily., Disp: 10 capsule, Rfl: 0   Cholecalciferol  (VITAMIN D ) 50 MCG (2000 UT) tablet, Take 1 tablet (2,000 Units total) by mouth daily for vitamin D  deficiency, Disp: 100 tablet, Rfl: 3   desvenlafaxine  (PRISTIQ ) 50 MG 24 hr tablet,  Take 1 tablet (50 mg total) by mouth daily., Disp: 30 tablet, Rfl: 3   Desvenlafaxine  ER (PRISTIQ ) 50 MG TB24, Take 1 tablet (50 mg total) by mouth daily for mood., Disp: 90 tablet, Rfl: 3   escitalopram  (LEXAPRO ) 10 MG tablet, Take 10 mg by mouth daily., Disp: , Rfl:    traZODone  (DESYREL ) 150 MG tablet, Take 1 tablet (150 mg total) by mouth daily sleep., Disp: 90 tablet, Rfl: 3   valACYclovir  (VALTREX ) 1000 MG tablet, Take 1 tablet (1,000 mg total) by mouth daily. Can take 1/2 pill daily for suppression after flare resolves, Disp: 30 tablet, Rfl: 5  Observations/Objective: Patient is well-developed, well-nourished in no acute distress.  Resting comfortably Head is normocephalic, atraumatic.  No labored breathing.  Speech is clear and coherent with logical content.  Patient is alert and oriented at baseline.    Assessment and Plan: 1. Suspected UTI (Primary) - nitrofurantoin , macrocrystal-monohydrate, (MACROBID ) 100 MG capsule; Take 1 capsule (100 mg total) by mouth 2 (two) times daily.  Dispense: 10 capsule; Refill: 0  - Worsening symptoms.  - Will treat empirically with Macrobid  - May use AZO for bladder spasms - Continue to push fluids.  - Seek in Monique Luna evaluation for urine culture if symptoms do not improve or if they worsen.    Follow Up Instructions: I discussed the assessment and treatment plan with the patient. The patient was provided an opportunity to ask questions and all were answered. The patient agreed with the plan and demonstrated an understanding of the instructions.  A copy of instructions were sent to the patient via MyChart unless otherwise noted below.    The patient was advised to call back or seek an in[facility] evaluation if the symptoms worsen or if the condition fails to improve as anticipated.    Angelia Kelp, PA-C

## 2023-10-04 DIAGNOSIS — F4323 Adjustment disorder with mixed anxiety and depressed mood: Secondary | ICD-10-CM | POA: Diagnosis not present

## 2023-10-07 NOTE — Addendum Note (Signed)
 Addended by: Angelia Kelp on: 10/07/2023 10:02 AM   Modules accepted: Level of Service

## 2023-10-17 DIAGNOSIS — F4323 Adjustment disorder with mixed anxiety and depressed mood: Secondary | ICD-10-CM | POA: Diagnosis not present

## 2023-10-21 DIAGNOSIS — Z0000 Encounter for general adult medical examination without abnormal findings: Secondary | ICD-10-CM | POA: Diagnosis not present

## 2023-10-21 DIAGNOSIS — N760 Acute vaginitis: Secondary | ICD-10-CM | POA: Diagnosis not present

## 2023-10-21 DIAGNOSIS — Z1389 Encounter for screening for other disorder: Secondary | ICD-10-CM | POA: Diagnosis not present

## 2023-10-21 DIAGNOSIS — F331 Major depressive disorder, recurrent, moderate: Secondary | ICD-10-CM | POA: Diagnosis not present

## 2023-10-21 DIAGNOSIS — Z1331 Encounter for screening for depression: Secondary | ICD-10-CM | POA: Diagnosis not present

## 2023-10-21 DIAGNOSIS — Z113 Encounter for screening for infections with a predominantly sexual mode of transmission: Secondary | ICD-10-CM | POA: Diagnosis not present

## 2023-10-31 DIAGNOSIS — F4323 Adjustment disorder with mixed anxiety and depressed mood: Secondary | ICD-10-CM | POA: Diagnosis not present

## 2023-11-12 ENCOUNTER — Encounter: Payer: Self-pay | Admitting: Emergency Medicine

## 2023-11-12 ENCOUNTER — Ambulatory Visit: Attending: Emergency Medicine | Admitting: Emergency Medicine

## 2023-11-12 VITALS — BP 111/73 | HR 94 | Ht 65.0 in | Wt 205.2 lb

## 2023-11-12 DIAGNOSIS — I340 Nonrheumatic mitral (valve) insufficiency: Secondary | ICD-10-CM

## 2023-11-12 DIAGNOSIS — R002 Palpitations: Secondary | ICD-10-CM

## 2023-11-12 NOTE — Progress Notes (Signed)
 Cardiology Office Note:    Date:  11/12/2023  ID:  Monique Luna, DOB 1990/12/19, MRN [id number] PCP: Default, Provider, MD  [facility] Providers Cardiologist:  Randene Bustard, MD       Patient Profile:       Chief Complaint: Follow-up for palpitations History of Present Illness:  Monique Luna is a 33 y.o. female with visit-pertinent history of deficiency anemia, palpitations  Patient established with cardiology service on 10/19/2021 for the evaluation of rapid heart rate/palpitations.  She was following up with the [facility] for evaluation of her anemia.  She was apparently had been placed on Victoza for obesity.  She indicated that she stopped taking earlier this month because of concern as well as giving her a fast heart rate.  She had noted her heart rate is going up intermittently into the 110 to 130 bpm range.  Echocardiogram was ordered and completed on 11/2021 showing LVEF 60 to 65%, no RWMA, normal diastolic parameters, RV function and size normal, mild MR.  Patient was last seen in office on 01/2022.  Patient was doing well at the time.  Frequency of tachycardia had decreased.  She was monitoring with Apple Watch.  It was thought that her episodes do not happen with the frequency to capture on outpatient monitor.  No medication changes were made.  She was to follow-up in 6 months.   Discussed the use of AI scribe software for clinical note transcription with the patient, who gave verbal consent to proceed.  History of Present Illness A 33 year old female with a history of tachycardia and palpitations presents for follow-up evaluation.  Last seen in 2023.  Today patient is doing well without acute cardiovascular concerns or complaints.  She tells me since her last office visit she is no longer experiencing any rapid palpitations or tachycardia.  She is unsure what resolved her issues but she does continue to monitor heart rate on her Apple Watch.  Her Apple  Watch showed that her average heart rate was in the 70s.  She has been without any chest pains, dyspnea, orthopnea, PND, leg swelling.  No syncope, presyncope, lightheadedness, dizziness.  Her family history includes significant heart disease, with her grandmother having had four heart attacks and her great-grandmother dying from a heart attack. She is motivated to monitor her heart health closely.  She acknowledges an unhealthy diet and is not currently on any cardiology medications.  No tobacco, drug or alcohol abuse.   Review of systems:  Please see the history of present illness. All other systems are reviewed and otherwise negative.      Studies Reviewed:    EKG Interpretation Date/Time:  Tuesday November 12 2023 15:25:02 EDT Ventricular Rate:  94 PR Interval:  142 QRS Duration:  80 QT Interval:  344 QTC Calculation: 430 R Axis:   34  Text Interpretation: Normal sinus rhythm Nonspecific T wave abnormality No previous ECGs available Confirmed by Palmer Bobo [phone]) on 11/12/2023 3:26:44 PM    Echocardiogram 11/21/2021 1. Left ventricular ejection fraction, by estimation, is 60 to 65%. The  left ventricle has normal function. The left ventricle has no regional  wall motion abnormalities. Left ventricular diastolic parameters were  normal.   2. Right ventricular systolic function is normal. The right ventricular  size is normal.   3. The mitral valve is normal in structure. Mild mitral valve  regurgitation. No evidence of mitral stenosis.   4. The aortic valve has an indeterminant number of cusps, likely  trileaflet. Aortic valve regurgitation is not visualized. No aortic  stenosis is present.   5. The inferior vena cava is normal in size with greater than 50%  respiratory variability, suggesting right atrial pressure of 3 mmHg.   Risk Assessment/Calculations:              Physical Exam:   VS:  BP 111/73 (BP Location: Left Arm)   Pulse 94   Ht 5\' 5"  (1.651 m)   Wt  205 lb 3.2 oz (93.1 kg)   SpO2 98%   BMI 34.15 kg/m    Wt Readings from Last 3 Encounters:  11/12/23 205 lb 3.2 oz (93.1 kg)  06/17/23 201 lb (91.2 kg)  03/29/22 199 lb 11.2 oz (90.6 kg)    GEN: Well nourished, well developed in no acute distress NECK: No JVD; No carotid bruits CARDIAC: RRR, no murmurs, rubs, gallops RESPIRATORY:  Clear to auscultation without rales, wheezing or rhonchi  ABDOMEN: Soft, non-tender, non-distended EXTREMITIES:  No edema; No acute deformity      Assessment and Plan:  Palpitations Tachycardia Resolved, no recurrent symptoms.  It was noted that her episodes in the past did not happen with the frequency to capture on an outpatient monitor and not enough to warrant ILR.  Today I reviewed her Apple Watch data over the past year showing no episodes of tachycardia > 130 bpm and an average HR in the 70s - EKG today showed NSR with no acute ischemic changes or arrhythmias - She denies any further episodes of tachycardia or palpitations over the past year.  - Recommended adequate hydration, weight loss, and heart healthy dieting   Mitral valve regurgitation Echocardiogram 11/2021 showed mild mitral valve regurgitation - Today she remains asymptomatic.  No indication for intervention at this time - Recommend repeat echocardiogram in 1 to 3 years for routine surveillance      Dispo:  Return in about 1 year (around 11/11/2024).  Signed, Ava Boatman, NP

## 2023-11-12 NOTE — Patient Instructions (Signed)
 Medication Instructions:  NO CHANGES  Lab Work: NONE  Testing/Procedures: NONE  Follow-Up: At [facility], you and your health needs are our priority.  As part of our continuing mission to provide you with exceptional heart care, our providers are all part of one team.  This team includes your primary Cardiologist (physician) and Advanced Practice Providers or APPs (Physician Assistants and Nurse Practitioners) who all work together to provide you with the care you need, when you need it.  Your next appointment:   1 YEAR  Provider:   MADISON FOUNTAIN, DNP

## 2023-11-27 DIAGNOSIS — Z3141 Encounter for fertility testing: Secondary | ICD-10-CM | POA: Diagnosis not present

## 2023-11-27 DIAGNOSIS — Z124 Encounter for screening for malignant neoplasm of cervix: Secondary | ICD-10-CM | POA: Diagnosis not present

## 2023-11-27 DIAGNOSIS — Z113 Encounter for screening for infections with a predominantly sexual mode of transmission: Secondary | ICD-10-CM | POA: Diagnosis not present

## 2023-11-27 DIAGNOSIS — Z1321 Encounter for screening for nutritional disorder: Secondary | ICD-10-CM | POA: Diagnosis not present

## 2023-11-27 DIAGNOSIS — Z131 Encounter for screening for diabetes mellitus: Secondary | ICD-10-CM | POA: Diagnosis not present

## 2023-11-27 DIAGNOSIS — Z1339 Encounter for screening examination for other mental health and behavioral disorders: Secondary | ICD-10-CM | POA: Diagnosis not present

## 2023-11-27 DIAGNOSIS — N898 Other specified noninflammatory disorders of vagina: Secondary | ICD-10-CM | POA: Diagnosis not present

## 2023-11-27 DIAGNOSIS — Z13220 Encounter for screening for lipoid disorders: Secondary | ICD-10-CM | POA: Diagnosis not present

## 2023-11-27 DIAGNOSIS — Z01411 Encounter for gynecological examination (general) (routine) with abnormal findings: Secondary | ICD-10-CM | POA: Diagnosis not present

## 2023-12-12 ENCOUNTER — Ambulatory Visit (INDEPENDENT_AMBULATORY_CARE_PROVIDER_SITE_OTHER): Admitting: Internal Medicine

## 2023-12-12 ENCOUNTER — Other Ambulatory Visit: Payer: Self-pay | Admitting: Internal Medicine

## 2023-12-12 VITALS — BP 128/78 | HR 90 | Ht 65.0 in | Wt 205.0 lb

## 2023-12-12 DIAGNOSIS — F4323 Adjustment disorder with mixed anxiety and depressed mood: Secondary | ICD-10-CM | POA: Diagnosis not present

## 2023-12-12 DIAGNOSIS — N912 Amenorrhea, unspecified: Secondary | ICD-10-CM | POA: Diagnosis not present

## 2023-12-12 LAB — POCT URINE PREGNANCY: Preg Test, Ur: POSITIVE — AB

## 2023-12-12 NOTE — Progress Notes (Signed)
 Date:  12/12/2023   Name:  Monique Luna   DOB:  1991-05-20   MRN:  [id number]   Chief Complaint: Amenorrhea  Vaginal Bleeding The patient's primary symptoms include missed menses. This is a new problem. The current episode started yesterday. The problem occurs intermittently. The problem has been unchanged. The patient is experiencing no pain. She is pregnant (positive home test 4 days ago). Pertinent negatives include no chills, dysuria, fever, hematuria or urgency. Nothing aggravates the symptoms. She has tried nothing for the symptoms.    Review of Systems  Constitutional:  Negative for chills, fatigue and fever.  Respiratory:  Negative for chest tightness, shortness of breath and wheezing.   Cardiovascular:  Negative for chest pain and palpitations.  Genitourinary:  Positive for missed menses and vaginal bleeding. Negative for dysuria, hematuria and urgency.  Psychiatric/Behavioral:  Negative for dysphoric mood and sleep disturbance. The patient is nervous/anxious.      Lab Results  Component Value Date   NA 141 02/22/2022   K 3.6 02/22/2022   CO2 28 02/22/2022   GLUCOSE 108 (H) 02/22/2022   BUN 15 02/22/2022   CREATININE 1.01 (H) 02/22/2022   CALCIUM 9.1 02/22/2022   GFRNONAA >60 02/22/2022   No results found for: CHOL, HDL, LDLCALC, LDLDIRECT, TRIG, CHOLHDL No results found for: TSH No results found for: HGBA1C Lab Results  Component Value Date   WBC 6.0 05/02/2023   HGB 11.2 (L) 05/02/2023   HCT 34.9 (L) 05/02/2023   MCV 78.8 (L) 05/02/2023   PLT 234 05/02/2023   Lab Results  Component Value Date   ALT 11 12/27/2021   AST 15 12/27/2021   ALKPHOS 81 12/27/2021   BILITOT 0.4 12/27/2021   No results found for: MARIEN BOLLS, VD25OH   Patient Active Problem List   Diagnosis Date Noted   Hypersomnia 06/17/2023   Obesity (BMI 30-39.9) 06/17/2023   Alpha thalassemia minor 06/12/2023   Major depressive disorder, single episode,  unspecified 02/22/2022   Localized swelling, mass and lump, unspecified lower limb 02/22/2022   Anxiety disorder, unspecified 02/22/2022   IDA (iron  deficiency anemia) 12/27/2021   Systolic click-murmur syndrome 10/20/2021   Rapid palpitations 10/20/2021    Allergies  Allergen Reactions   Wellbutrin  [Bupropion ]     Blurred vision, muscle spasms. Sensitivity to light    Past Surgical History:  Procedure Laterality Date   BREAST REDUCTION SURGERY Bilateral 03/24/2021   Procedure: MAMMARY REDUCTION  (BREAST);  Surgeon: Elisabeth Craig RAMAN, MD;  Location: [facility];  Service: Plastics;  Laterality: Bilateral;   CHOLECYSTECTOMY  2009   [facility]   COSMETIC SURGERY  03/24/2021   Breat reduction   TRANSTHORACIC ECHOCARDIOGRAM  11/21/2021   Technically difficult-poor echo windows.  Suboptimal apical and subcostal windows.  Definity contrast not used.  EF estimated at 60 to 65% no RWMA.  Normal diastolic pressures.  Normal RV size and function.  Only mild MR with no MVP.  Normal AOV.  Normal RAP.    Social History   Tobacco Use   Smoking status: Never   Smokeless tobacco: Never  Vaping Use   Vaping status: Never Used  Substance Use Topics   Alcohol use: Not Currently    Comment: Rarely   Drug use: No     Medication list has been reviewed and updated.  Current Meds  Medication Sig   Cholecalciferol  (VITAMIN D ) 50 MCG (2000 UT) tablet Take 1 tablet (2,000 Units total) by mouth daily for  vitamin D  deficiency   desvenlafaxine  (PRISTIQ ) 25 MG 24 hr tablet Take 25 mg by mouth.   valACYclovir  (VALTREX ) 1000 MG tablet Take 1 tablet (1,000 mg total) by mouth daily. Can take 1/2 pill daily for suppression after flare resolves       12/12/2023    8:52 AM  GAD 7 : Generalized Anxiety Score  Nervous, Anxious, on Edge 0  Control/stop worrying 0  Worry too much - different things 0  Trouble relaxing 0  Restless 0  Easily annoyed or irritable 0  Afraid - awful might  happen 0  Total GAD 7 Score 0  Anxiety Difficulty Not difficult at all       12/12/2023    8:51 AM  Depression screen PHQ 2/9  Decreased Interest 0  Down, Depressed, Hopeless 0  PHQ - 2 Score 0  Altered sleeping 0  Tired, decreased energy 0  Change in appetite 0  Feeling bad or failure about yourself  0  Trouble concentrating 0  Moving slowly or fidgety/restless 0  Suicidal thoughts 0  PHQ-9 Score 0  Difficult doing work/chores Not difficult at all    BP Readings from Last 3 Encounters:  12/12/23 128/78  11/12/23 111/73  06/17/23 109/81    Physical Exam Vitals and nursing note reviewed.  Constitutional:      General: She is not in acute distress.    Appearance: Normal appearance. She is well-developed.  HENT:     Head: Normocephalic and atraumatic.  Cardiovascular:     Rate and Rhythm: Normal rate and regular rhythm.  Pulmonary:     Effort: Pulmonary effort is normal. No respiratory distress.     Breath sounds: No wheezing or rhonchi.  Abdominal:     General: Abdomen is flat. There is no distension.     Palpations: Abdomen is soft. There is no mass.     Tenderness: There is no abdominal tenderness.  Musculoskeletal:     Cervical back: Normal range of motion.  Lymphadenopathy:     Cervical: No cervical adenopathy.  Skin:    General: Skin is warm and dry.     Capillary Refill: Capillary refill takes less than 2 seconds.     Findings: No rash.  Neurological:     General: No focal deficit present.     Mental Status: She is alert and oriented to [place], place, and time.  Psychiatric:        Mood and Affect: Mood normal.        Behavior: Behavior normal.     Wt Readings from Last 3 Encounters:  12/12/23 205 lb (93 kg)  11/12/23 205 lb 3.2 oz (93.1 kg)  06/17/23 201 lb (91.2 kg)    BP 128/78   Pulse 90   Ht 5' 5 (1.651 m)   Wt 205 lb (93 kg)   SpO2 98%   Breastfeeding No   BMI 34.11 kg/m   Assessment and Plan:  Problem List Items Addressed This  Visit   None Visit Diagnoses       Amenorrhea    -  Primary   positive home test and here; OB appt August spotting may be normal - reassured will get quantiative HCG today and repeat in one week   Relevant Orders   hCG, serum, qualitative   POCT urine pregnancy (Completed)   B-HCG Quant       No follow-ups on file.    Leita HILARIO Adie, MD [facility]  [place]

## 2023-12-12 NOTE — Addendum Note (Signed)
 Addended by: CLAUDIS EUAL SAILOR on: 12/12/2023 09:12 AM   Modules accepted: Orders

## 2023-12-16 DIAGNOSIS — O209 Hemorrhage in early pregnancy, unspecified: Secondary | ICD-10-CM | POA: Diagnosis not present

## 2023-12-16 LAB — BETA HCG QUANT (REF LAB): hCG Quant: 39 m[IU]/mL

## 2023-12-18 ENCOUNTER — Other Ambulatory Visit: Payer: Self-pay | Admitting: Internal Medicine

## 2023-12-18 DIAGNOSIS — N912 Amenorrhea, unspecified: Secondary | ICD-10-CM | POA: Diagnosis not present

## 2023-12-19 LAB — BETA HCG QUANT (REF LAB): hCG Quant: 3 m[IU]/mL

## 2024-01-03 DIAGNOSIS — F4323 Adjustment disorder with mixed anxiety and depressed mood: Secondary | ICD-10-CM | POA: Diagnosis not present

## 2024-01-09 DIAGNOSIS — F331 Major depressive disorder, recurrent, moderate: Secondary | ICD-10-CM | POA: Diagnosis not present

## 2024-01-13 DIAGNOSIS — F4323 Adjustment disorder with mixed anxiety and depressed mood: Secondary | ICD-10-CM | POA: Diagnosis not present

## 2024-01-17 ENCOUNTER — Encounter

## 2024-01-18 ENCOUNTER — Other Ambulatory Visit: Payer: Self-pay

## 2024-01-20 ENCOUNTER — Other Ambulatory Visit: Payer: Self-pay

## 2024-01-21 ENCOUNTER — Other Ambulatory Visit: Payer: Self-pay | Admitting: Internal Medicine

## 2024-01-21 ENCOUNTER — Other Ambulatory Visit: Payer: Self-pay

## 2024-01-23 ENCOUNTER — Other Ambulatory Visit: Payer: Self-pay

## 2024-01-24 ENCOUNTER — Other Ambulatory Visit: Payer: Self-pay

## 2024-01-24 NOTE — Telephone Encounter (Signed)
 Requested medications are due for refill today.  unsure  Requested medications are on the active medications list.  yes  Last refill.   Future visit scheduled.   no  Notes to clinic.  No pcp listed. Please review for refill.    Requested Prescriptions  Pending Prescriptions Disp Refills   Cholecalciferol  (VITAMIN D3) 50 MCG (2000 UT) TABS [Pharmacy Med Name: Cholecalciferol  (VITAMIN D ) 50 MCG (2000 UT) tablet] 100 tablet 3    Sig: Take 1 tablet (2,000 Units total) by mouth daily for vitamin D  deficiency     Endocrinology:  Vitamins - Vitamin D  Supplementation 2 Failed - 01/24/2024 11:32 AM      Failed - Manual Review: Route requests for 50,000 IU strength to the provider      Failed - Ca in normal range and within 360 days    Calcium  Date Value Ref Range Status  02/22/2022 9.1 8.9 - 10.3 mg/dL Final         Failed - Vitamin D  in normal range and within 360 days    No results found for: [id number], CI6874NY7, [id number], 25OHVITD3, 25OHVITD2, 25OHVITD1, VD25OH       Passed - Valid encounter within last 12 months    Recent Outpatient Visits           1 month ago Amenorrhea   [facility] at [facility] Leita DEL, MD       Future Appointments             In 3 months Babara Call, MD [facility] - A Dept Of [place] [facility]. [facility]

## 2024-01-27 DIAGNOSIS — Z1389 Encounter for screening for other disorder: Secondary | ICD-10-CM | POA: Diagnosis not present

## 2024-01-27 DIAGNOSIS — R7303 Prediabetes: Secondary | ICD-10-CM | POA: Diagnosis not present

## 2024-01-27 DIAGNOSIS — F331 Major depressive disorder, recurrent, moderate: Secondary | ICD-10-CM | POA: Diagnosis not present

## 2024-01-27 DIAGNOSIS — E559 Vitamin D deficiency, unspecified: Secondary | ICD-10-CM | POA: Diagnosis not present

## 2024-01-30 ENCOUNTER — Other Ambulatory Visit: Payer: Self-pay

## 2024-01-30 DIAGNOSIS — F4323 Adjustment disorder with mixed anxiety and depressed mood: Secondary | ICD-10-CM | POA: Diagnosis not present

## 2024-02-05 ENCOUNTER — Other Ambulatory Visit
Admission: RE | Admit: 2024-02-05 | Discharge: 2024-02-05 | Disposition: A | Discharge status: Home / Self Care | Payer: Self-pay | Source: Ambulatory Visit | Attending: Medical Genetics | Admitting: Medical Genetics

## 2024-02-05 ENCOUNTER — Other Ambulatory Visit: Payer: Self-pay | Admitting: Medical Genetics

## 2024-02-05 DIAGNOSIS — Z006 Encounter for examination for normal comparison and control in clinical research program: Secondary | ICD-10-CM | POA: Insufficient documentation

## 2024-02-05 DIAGNOSIS — F4323 Adjustment disorder with mixed anxiety and depressed mood: Secondary | ICD-10-CM | POA: Diagnosis not present

## 2024-02-07 ENCOUNTER — Encounter: Admitting: Family Medicine

## 2024-02-14 ENCOUNTER — Other Ambulatory Visit: Payer: Self-pay | Admitting: Medical Genetics

## 2024-02-14 DIAGNOSIS — Z006 Encounter for examination for normal comparison and control in clinical research program: Secondary | ICD-10-CM

## 2024-02-17 DIAGNOSIS — F4323 Adjustment disorder with mixed anxiety and depressed mood: Secondary | ICD-10-CM | POA: Diagnosis not present

## 2024-02-21 LAB — GENECONNECT MOLECULAR SCREEN: Genetic Analysis Overall Interpretation: NEGATIVE

## 2024-02-24 DIAGNOSIS — F331 Major depressive disorder, recurrent, moderate: Secondary | ICD-10-CM | POA: Diagnosis not present

## 2024-02-24 DIAGNOSIS — Z1389 Encounter for screening for other disorder: Secondary | ICD-10-CM | POA: Diagnosis not present

## 2024-02-26 ENCOUNTER — Other Ambulatory Visit: Payer: Self-pay

## 2024-02-26 MED ORDER — SERTRALINE HCL 50 MG PO TABS
50.0000 mg | ORAL_TABLET | Freq: Every day | ORAL | 3 refills | Status: AC
  Filled 2024-02-26: qty 90, 90d supply, fill #0
  Filled 2024-05-26: qty 90, 90d supply, fill #1

## 2024-03-12 DIAGNOSIS — F4323 Adjustment disorder with mixed anxiety and depressed mood: Secondary | ICD-10-CM | POA: Diagnosis not present

## 2024-03-24 ENCOUNTER — Ambulatory Visit (INDEPENDENT_AMBULATORY_CARE_PROVIDER_SITE_OTHER): Admitting: Internal Medicine

## 2024-03-24 VITALS — BP 108/68 | HR 98 | Temp 98.2°F | Ht 65.0 in | Wt 204.0 lb

## 2024-03-24 DIAGNOSIS — J040 Acute laryngitis: Secondary | ICD-10-CM | POA: Diagnosis not present

## 2024-03-24 LAB — POC COVID19/FLU A&B COMBO
Covid Antigen, POC: NEGATIVE
Influenza A Antigen, POC: NEGATIVE
Influenza B Antigen, POC: NEGATIVE

## 2024-03-24 MED ORDER — PREDNISONE 10 MG PO TABS: ORAL_TABLET | ORAL | 0 refills | Status: AC

## 2024-03-24 NOTE — Progress Notes (Signed)
 Date:  03/24/2024   Name:  Monique Luna   DOB:  1990-12-05   MRN:  [id number]   Chief Complaint: Sore Throat (Started Saturday- 2 days ago. Sore throat and cough. Pt still has cough and has completely lost her voice. Wants covid/flu test- and prednisone for Laryngitis. )  Sore Throat  This is a new (and hoarseness) problem. Episode onset: past 4 days. The problem has been unchanged. Neither side of throat is experiencing more pain than the other. There has been no fever. The pain is mild. Pertinent negatives include no congestion, headaches, shortness of breath, swollen glands or trouble swallowing. She has had no exposure to strep or mono. She has tried acetaminophen  for the symptoms. The treatment provided no relief.    Review of Systems  Constitutional:  Negative for chills, fatigue and fever.  HENT:  Positive for sore throat and voice change. Negative for congestion, postnasal drip, sinus pressure and trouble swallowing.   Respiratory:  Negative for chest tightness, shortness of breath and wheezing.   Cardiovascular:  Negative for chest pain.  Neurological:  Negative for headaches.     Lab Results  Component Value Date   NA 141 02/22/2022   K 3.6 02/22/2022   CO2 28 02/22/2022   GLUCOSE 108 (H) 02/22/2022   BUN 15 02/22/2022   CREATININE 1.01 (H) 02/22/2022   CALCIUM 9.1 02/22/2022   GFRNONAA >60 02/22/2022   No results found for: CHOL, HDL, LDLCALC, LDLDIRECT, TRIG, CHOLHDL No results found for: TSH No results found for: HGBA1C Lab Results  Component Value Date   WBC 6.0 05/02/2023   HGB 11.2 (L) 05/02/2023   HCT 34.9 (L) 05/02/2023   MCV 78.8 (L) 05/02/2023   PLT 234 05/02/2023   Lab Results  Component Value Date   ALT 11 12/27/2021   AST 15 12/27/2021   ALKPHOS 81 12/27/2021   BILITOT 0.4 12/27/2021   No results found for: MARIEN BOLLS, VD25OH   Patient Active Problem List   Diagnosis Date Noted   Hypersomnia 06/17/2023    Obesity (BMI 30-39.9) 06/17/2023   Alpha thalassemia minor 06/12/2023   Major depressive disorder, single episode, unspecified 02/22/2022   Localized swelling, mass and lump, unspecified lower limb 02/22/2022   Anxiety disorder, unspecified 02/22/2022   IDA (iron  deficiency anemia) 12/27/2021   Major depressive disorder, recurrent, moderate (HCC) 12/19/2021   Systolic click-murmur syndrome 10/20/2021   Rapid palpitations 10/20/2021   Vitamin D  deficiency 02/27/2016    Allergies  Allergen Reactions   Wellbutrin  [Bupropion ]     Blurred vision, muscle spasms. Sensitivity to light    Past Surgical History:  Procedure Laterality Date   BREAST REDUCTION SURGERY Bilateral 03/24/2021   Procedure: MAMMARY REDUCTION  (BREAST);  Surgeon: Elisabeth Craig RAMAN, MD;  Location:  SURGERY CENTER;  Service: Plastics;  Laterality: Bilateral;   CHOLECYSTECTOMY  2009   [facility]   COSMETIC SURGERY  03/24/2021   Breat reduction   TRANSTHORACIC ECHOCARDIOGRAM  11/21/2021   Technically difficult-poor echo windows.  Suboptimal apical and subcostal windows.  Definity contrast not used.  EF estimated at 60 to 65% no RWMA.  Normal diastolic pressures.  Normal RV size and function.  Only mild MR with no MVP.  Normal AOV.  Normal RAP.    Social History   Tobacco Use   Smoking status: Never   Smokeless tobacco: Never  Vaping Use   Vaping status: Never Used  Substance Use Topics   Alcohol use: Not  Currently    Comment: Rarely   Drug use: No     Medication list has been reviewed and updated.  Current Meds  Medication Sig   Cholecalciferol  (VITAMIN D ) 50 MCG (2000 UT) tablet Take 1 tablet (2,000 Units total) by mouth daily for vitamin D  deficiency   Ferrous Sulfate  (IRON  PO) Take 1 tablet by mouth daily.   hydrOXYzine (ATARAX) 10 MG tablet Take 10 mg by mouth every 6 (six) hours as needed.   predniSONE (DELTASONE) 10 MG tablet Take 6 tablets (60 mg total) by mouth daily with breakfast  for 1 day, THEN 5 tablets (50 mg total) daily with breakfast for 1 day, THEN 4 tablets (40 mg total) daily with breakfast for 1 day, THEN 3 tablets (30 mg total) daily with breakfast for 1 day, THEN 2 tablets (20 mg total) daily with breakfast for 1 day, THEN 1 tablet (10 mg total) daily with breakfast for 1 day.   sertraline  (ZOLOFT ) 50 MG tablet Take 1 tablet (50 mg total) by mouth daily for mood.   valACYclovir  (VALTREX ) 1000 MG tablet Take 1 tablet (1,000 mg total) by mouth daily. Can take 1/2 pill daily for suppression after flare resolves       03/24/2024   11:13 AM 12/12/2023    8:52 AM  GAD 7 : Generalized Anxiety Score  Nervous, Anxious, on Edge 0 0  Control/stop worrying 0 0  Worry too much - different things 0 0  Trouble relaxing 0 0  Restless 0 0  Easily annoyed or irritable 0 0  Afraid - awful might happen 0 0  Total GAD 7 Score 0 0  Anxiety Difficulty Not difficult at all Not difficult at all       03/24/2024   11:12 AM 12/12/2023    8:51 AM  Depression screen PHQ 2/9  Decreased Interest 0 0  Down, Depressed, Hopeless 0 0  PHQ - 2 Score 0 0  Altered sleeping 0 0  Tired, decreased energy 0 0  Change in appetite 0 0  Feeling bad or failure about yourself  0 0  Trouble concentrating 0 0  Moving slowly or fidgety/restless 0 0  Suicidal thoughts 0 0  PHQ-9 Score 0 0  Difficult doing work/chores Not difficult at all Not difficult at all    BP Readings from Last 3 Encounters:  03/24/24 108/68  12/12/23 128/78  11/12/23 111/73    Physical Exam Vitals and nursing note reviewed.  Constitutional:      General: She is not in acute distress.    Appearance: She is well-developed. She is not ill-appearing.  HENT:     Head: Normocephalic and atraumatic.     Right Ear: Tympanic membrane normal.     Left Ear: Tympanic membrane normal.     Nose:     Right Sinus: No maxillary sinus tenderness or frontal sinus tenderness.     Left Sinus: No maxillary sinus tenderness or  frontal sinus tenderness.     Mouth/Throat:     Mouth: Mucous membranes are moist.     Tongue: No lesions.     Palate: No mass.     Pharynx: Uvula midline. Pharyngeal swelling present. No oropharyngeal exudate, posterior oropharyngeal erythema or postnasal drip.     Comments: Unable to speak above a whisper Cardiovascular:     Rate and Rhythm: Normal rate and regular rhythm.  Pulmonary:     Effort: Pulmonary effort is normal. No respiratory distress.     Breath  sounds: No wheezing or rhonchi.  Musculoskeletal:     Cervical back: Normal range of motion.  Lymphadenopathy:     Cervical: No cervical adenopathy.  Skin:    General: Skin is warm and dry.     Findings: No rash.  Neurological:     Mental Status: She is alert and oriented to [place], place, and time.  Psychiatric:        Mood and Affect: Mood normal.        Behavior: Behavior normal.     Wt Readings from Last 3 Encounters:  03/24/24 204 lb (92.5 kg)  12/12/23 205 lb (93 kg)  11/12/23 205 lb 3.2 oz (93.1 kg)    BP 108/68   Pulse 98   Temp 98.2 F (36.8 C) (Oral)   Ht 5' 5 (1.651 m)   Wt 204 lb (92.5 kg)   SpO2 97%   BMI 33.95 kg/m   Assessment and Plan:  Problem List Items Addressed This Visit   None Visit Diagnoses       Laryngitis    -  Primary   likely unspecificed viral etiology. Covid and Flu negative treat with voice rest and steroid taper   Relevant Medications   predniSONE (DELTASONE) 10 MG tablet   Other Relevant Orders   POC Covid19/Flu A&B Antigen (Completed)       No follow-ups on file.    Leita HILARIO Adie, MD [facility] [place]

## 2024-04-06 DIAGNOSIS — Z3482 Encounter for supervision of other normal pregnancy, second trimester: Secondary | ICD-10-CM | POA: Diagnosis not present

## 2024-04-06 DIAGNOSIS — Z3483 Encounter for supervision of other normal pregnancy, third trimester: Secondary | ICD-10-CM | POA: Diagnosis not present

## 2024-04-12 DIAGNOSIS — O209 Hemorrhage in early pregnancy, unspecified: Secondary | ICD-10-CM | POA: Diagnosis not present

## 2024-04-13 ENCOUNTER — Emergency Department
Admission: EM | Admit: 2024-04-13 | Discharge: 2024-04-13 | Disposition: A | Discharge status: Home / Self Care | Attending: Emergency Medicine | Admitting: Emergency Medicine

## 2024-04-13 ENCOUNTER — Other Ambulatory Visit: Payer: Self-pay

## 2024-04-13 ENCOUNTER — Other Ambulatory Visit
Admission: RE | Admit: 2024-04-13 | Discharge: 2024-04-13 | Disposition: A | Source: Ambulatory Visit | Attending: Family Medicine | Admitting: Family Medicine

## 2024-04-13 ENCOUNTER — Emergency Department

## 2024-04-13 DIAGNOSIS — O4690 Antepartum hemorrhage, unspecified, unspecified trimester: Secondary | ICD-10-CM

## 2024-04-13 DIAGNOSIS — O3680X0 Pregnancy with inconclusive fetal viability, not applicable or unspecified: Secondary | ICD-10-CM | POA: Diagnosis not present

## 2024-04-13 DIAGNOSIS — O209 Hemorrhage in early pregnancy, unspecified: Secondary | ICD-10-CM | POA: Diagnosis not present

## 2024-04-13 DIAGNOSIS — Z3A00 Weeks of gestation of pregnancy not specified: Secondary | ICD-10-CM | POA: Diagnosis not present

## 2024-04-13 DIAGNOSIS — Z3A01 Less than 8 weeks gestation of pregnancy: Secondary | ICD-10-CM | POA: Diagnosis not present

## 2024-04-13 LAB — URINALYSIS, ROUTINE W REFLEX MICROSCOPIC
Bilirubin Urine: NEGATIVE
Glucose, UA: NEGATIVE mg/dL
Hgb urine dipstick: NEGATIVE
Ketones, ur: NEGATIVE mg/dL
Leukocytes,Ua: NEGATIVE
Nitrite: NEGATIVE
Protein, ur: NEGATIVE mg/dL
Specific Gravity, Urine: 1.028 (ref 1.005–1.030)
pH: 5 (ref 5.0–8.0)

## 2024-04-13 LAB — CBC WITH DIFFERENTIAL/PLATELET
Abs Immature Granulocytes: 0.01 K/uL (ref 0.00–0.07)
Basophils Absolute: 0 K/uL (ref 0.0–0.1)
Basophils Relative: 1 %
Eosinophils Absolute: 0.1 K/uL (ref 0.0–0.5)
Eosinophils Relative: 1 %
HCT: 38.6 % (ref 36.0–46.0)
Hemoglobin: 11.6 g/dL — ABNORMAL LOW (ref 12.0–15.0)
Immature Granulocytes: 0 %
Lymphocytes Relative: 39 %
Lymphs Abs: 2.5 K/uL (ref 0.7–4.0)
MCH: 23.2 pg — ABNORMAL LOW (ref 26.0–34.0)
MCHC: 30.1 g/dL (ref 30.0–36.0)
MCV: 77.4 fL — ABNORMAL LOW (ref 80.0–100.0)
Monocytes Absolute: 0.4 K/uL (ref 0.1–1.0)
Monocytes Relative: 6 %
Neutro Abs: 3.4 K/uL (ref 1.7–7.7)
Neutrophils Relative %: 53 %
Platelets: 289 K/uL (ref 150–400)
RBC: 4.99 MIL/uL (ref 3.87–5.11)
RDW: 14.5 % (ref 11.5–15.5)
WBC: 6.3 K/uL (ref 4.0–10.5)
nRBC: 0 % (ref 0.0–0.2)

## 2024-04-13 LAB — HCG, QUANTITATIVE, PREGNANCY
hCG, Beta Chain, Quant, S: 39 m[IU]/mL — ABNORMAL HIGH (ref <5)
hCG, Beta Chain, Quant, S: 34 m[IU]/mL — ABNORMAL HIGH (ref <5)

## 2024-04-13 LAB — POC URINE PREG, ED: Preg Test, Ur: POSITIVE — AB

## 2024-04-13 NOTE — ED Triage Notes (Signed)
 Pt comes with c/o vaginal bleeding hat started after intercourse on Saturday. Pt states she is pregnant and had positive test week ago but hasn't had appt yet. Pt states lower back cramping. Pt states it was dark brown and then red. Pt states when she was at work today and went to the bathroom she had brown colored bleeding and some clots present.   Pt states hx of miscarriage in June.

## 2024-04-13 NOTE — ED Notes (Signed)
 See triage note  Presents with some vaginal bleeding   States she recently had a positive pregnancy test about a week ago. Developed sl cramping with some bleeding this am

## 2024-04-13 NOTE — ED Provider Notes (Signed)
 [facility] Provider Note    Event Date/Time   First MD Initiated Contact with Patient 04/13/24 [phone]     (approximate)   History   Vaginal Bleeding   HPI  Monique Luna is a 33 y.o. female   presents to the ED with complaint of vaginal bleeding.  Patient was seen in urgent care yesterday where an hCG was done but she did not get the results of the test because possibly it was sent out of urgent care.  Patient reports that she is not bleeding enough to have to wear any pads and only sees it during urination.  She has has some low back pain and twinges but no actual abdominal pain or vaginal discharge or discomfort.  Patient has a history of a miscarriage in June.  She also has a history of iron  deficiency anemia, alpha thalassemia minor, anxiety disorder, vitamin D  deficiency, major depressive disorder and history of palpitations.      Physical Exam   Triage Vital Signs: ED Triage Vitals  Encounter Vitals Group     BP 04/13/24 0945 (!) 141/85     Girls Systolic BP Percentile --      Girls Diastolic BP Percentile --      Boys Systolic BP Percentile --      Boys Diastolic BP Percentile --      Pulse Rate 04/13/24 0945 (!) 104     Resp 04/13/24 0945 18     Temp 04/13/24 0945 98 F (36.7 C)     Temp src --      SpO2 04/13/24 0945 100 %     Weight 04/13/24 0944 204 lb (92.5 kg)     Height 04/13/24 0944 5' 5 (1.651 m)     Head Circumference --      Peak Flow --      Pain Score 04/13/24 0943 3     Pain Loc --      Pain Education --      Exclude from Growth Chart --     Most recent vital signs: Vitals:   04/13/24 0945  BP: (!) 141/85  Pulse: (!) 104  Resp: 18  Temp: 98 F (36.7 C)  SpO2: 100%     General: Awake, no distress.  CV:  Good peripheral perfusion.  Resp:  Normal effort.  Abd:  No distention.  Other:     ED Results / Procedures / Treatments   Labs (all labs ordered are listed, but only abnormal results are  displayed) Labs Reviewed  CBC WITH DIFFERENTIAL/PLATELET - Abnormal; Notable for the following components:      Result Value   Hemoglobin 11.6 (*)    MCV 77.4 (*)    MCH 23.2 (*)    All other components within normal limits  URINALYSIS, ROUTINE W REFLEX MICROSCOPIC - Abnormal; Notable for the following components:   Color, Urine YELLOW (*)    APPearance HAZY (*)    All other components within normal limits  HCG, QUANTITATIVE, PREGNANCY - Abnormal; Notable for the following components:   hCG, Beta Chain, Quant, S 34 (*)    All other components within normal limits  POC URINE PREG, ED - Abnormal; Notable for the following components:   Preg Test, Ur Positive (*)    All other components within normal limits     RADIOLOGY Ultrasound with OB transvaginal per radiology shows no IUP with differential considerations including an early pregnancy, complete miscarriage or an ectopic pregnancy.  Suggest  serial beta hCGs and sonogram.    PROCEDURES:  Critical Care performed:   Procedures   MEDICATIONS ORDERED IN ED: Medications - No data to display   IMPRESSION / MDM / ASSESSMENT AND PLAN / ED COURSE  I reviewed the triage vital signs and the nursing notes.   Differential diagnosis includes, but is not limited to, vaginal bleeding in first trimester pregnancy, ectopic pregnancy, miscarriage.  33 year old female presents to the ED with concerns for vaginal bleeding that started Saturday after intercourse.  Patient has a history of miscarriage with last 1 being in June.  She did come tact her OB/GYN who referred her to the emergency department.  Patient reports that she will be seeing [facility].  Results of her hCG and ultrasound were discussed with her.  Strongly encouraged her to call today to make arrangements for her to have a serial hCG and ultrasound in the office but if they are unable to get her into the office she is to return to the emergency department.  She also  was told to return to the emergency department if any severe worsening of her symptoms.      Patient's presentation is most consistent with acute illness / injury with system symptoms.  FINAL CLINICAL IMPRESSION(S) / ED DIAGNOSES   Final diagnoses:  Vaginal bleeding in pregnancy     Rx / DC Orders   ED Discharge Orders     None        Note:  This document was prepared using Dragon voice recognition software and may include unintentional dictation errors.   Saunders Shona CROME, PA-C 04/13/24 1455    Arlander Charleston, MD 04/13/24 1500

## 2024-04-13 NOTE — Discharge Instructions (Addendum)
 Call OB/GYN at [facility] to schedule follow-up.  Your hCG will need to be repeated in 72 hours.  Return to the emergency department if any severe worsening of your symptoms such as increased pain in your abdomen.  At this time your hCG is 34.  Ultrasound did not see a pregnancy at this time which may be too early to see, a complete miscarriage or is also too early to see if this is an ectopic pregnancy.

## 2024-04-15 DIAGNOSIS — O200 Threatened abortion: Secondary | ICD-10-CM | POA: Diagnosis not present

## 2024-04-16 DIAGNOSIS — F4323 Adjustment disorder with mixed anxiety and depressed mood: Secondary | ICD-10-CM | POA: Diagnosis not present

## 2024-04-23 DIAGNOSIS — O200 Threatened abortion: Secondary | ICD-10-CM | POA: Diagnosis not present

## 2024-05-01 ENCOUNTER — Encounter

## 2024-05-01 DIAGNOSIS — F4323 Adjustment disorder with mixed anxiety and depressed mood: Secondary | ICD-10-CM | POA: Diagnosis not present

## 2024-05-14 ENCOUNTER — Other Ambulatory Visit: Admission: RE | Admit: 2024-05-14 | Discharge: 2024-05-14 | Disposition: A | Attending: Oncology | Admitting: Oncology

## 2024-05-14 ENCOUNTER — Other Ambulatory Visit: Payer: Self-pay

## 2024-05-14 ENCOUNTER — Inpatient Hospital Stay: Payer: Commercial Managed Care - PPO | Attending: Oncology

## 2024-05-14 DIAGNOSIS — R5383 Other fatigue: Secondary | ICD-10-CM | POA: Insufficient documentation

## 2024-05-14 DIAGNOSIS — D508 Other iron deficiency anemias: Secondary | ICD-10-CM | POA: Insufficient documentation

## 2024-05-14 DIAGNOSIS — D509 Iron deficiency anemia, unspecified: Secondary | ICD-10-CM | POA: Insufficient documentation

## 2024-05-14 DIAGNOSIS — Z79899 Other long term (current) drug therapy: Secondary | ICD-10-CM | POA: Insufficient documentation

## 2024-05-14 DIAGNOSIS — D563 Thalassemia minor: Secondary | ICD-10-CM | POA: Insufficient documentation

## 2024-05-14 DIAGNOSIS — Z79624 Long term (current) use of inhibitors of nucleotide synthesis: Secondary | ICD-10-CM | POA: Insufficient documentation

## 2024-05-14 LAB — CBC WITH DIFFERENTIAL (CANCER CENTER ONLY)
Abs Immature Granulocytes: 0.01 K/uL (ref 0.00–0.07)
Basophils Absolute: 0 K/uL (ref 0.0–0.1)
Basophils Relative: 1 %
Eosinophils Absolute: 0.1 K/uL (ref 0.0–0.5)
Eosinophils Relative: 3 %
HCT: 35.8 % — ABNORMAL LOW (ref 36.0–46.0)
Hemoglobin: 10.9 g/dL — ABNORMAL LOW (ref 12.0–15.0)
Immature Granulocytes: 0 %
Lymphocytes Relative: 43 %
Lymphs Abs: 2.2 K/uL (ref 0.7–4.0)
MCH: 23.6 pg — ABNORMAL LOW (ref 26.0–34.0)
MCHC: 30.4 g/dL (ref 30.0–36.0)
MCV: 77.5 fL — ABNORMAL LOW (ref 80.0–100.0)
Monocytes Absolute: 0.3 K/uL (ref 0.1–1.0)
Monocytes Relative: 7 %
Neutro Abs: 2.5 K/uL (ref 1.7–7.7)
Neutrophils Relative %: 46 %
Platelet Count: 255 K/uL (ref 150–400)
RBC: 4.62 MIL/uL (ref 3.87–5.11)
RDW: 15.2 % (ref 11.5–15.5)
WBC Count: 5.2 K/uL (ref 4.0–10.5)
nRBC: 0 % (ref 0.0–0.2)

## 2024-05-14 LAB — IRON AND TIBC
Iron: 31 ug/dL (ref 28–170)
Saturation Ratios: 9 % — ABNORMAL LOW (ref 10.4–31.8)
TIBC: 354 ug/dL (ref 250–450)
UIBC: 323 ug/dL

## 2024-05-14 LAB — FERRITIN: Ferritin: 73 ng/mL (ref 11–307)

## 2024-05-15 ENCOUNTER — Encounter: Admitting: Obstetrics and Gynecology

## 2024-05-18 DIAGNOSIS — F4323 Adjustment disorder with mixed anxiety and depressed mood: Secondary | ICD-10-CM | POA: Diagnosis not present

## 2024-05-21 ENCOUNTER — Encounter: Payer: Self-pay | Admitting: Oncology

## 2024-05-21 ENCOUNTER — Telehealth: Payer: Commercial Managed Care - PPO | Admitting: Oncology

## 2024-05-21 DIAGNOSIS — Z79624 Long term (current) use of inhibitors of nucleotide synthesis: Secondary | ICD-10-CM | POA: Diagnosis not present

## 2024-05-21 DIAGNOSIS — Z79899 Other long term (current) drug therapy: Secondary | ICD-10-CM | POA: Diagnosis not present

## 2024-05-21 DIAGNOSIS — R5383 Other fatigue: Secondary | ICD-10-CM | POA: Diagnosis not present

## 2024-05-21 DIAGNOSIS — D509 Iron deficiency anemia, unspecified: Secondary | ICD-10-CM | POA: Diagnosis present

## 2024-05-21 DIAGNOSIS — D563 Thalassemia minor: Secondary | ICD-10-CM | POA: Diagnosis not present

## 2024-05-21 DIAGNOSIS — D508 Other iron deficiency anemias: Secondary | ICD-10-CM

## 2024-05-21 NOTE — Assessment & Plan Note (Signed)
 Discussed with patient that Alpha thalassemia minor is a genetic condition where a Niesen has one or two mutated genes for alpha globin, leading to mild anemia but usually no serious health issues.  suggest  genetic counseling before plan of conceiving.

## 2024-05-21 NOTE — Addendum Note (Signed)
 Addended by: BABARA CALL on: 05/21/2024 10:24 PM   Modules accepted: Orders

## 2024-05-21 NOTE — Assessment & Plan Note (Signed)
 Labs are reviewed and discussed with patient. Lab Results  Component Value Date   HGB 10.9 (L) 05/14/2024   TIBC 354 05/14/2024   IRONPCTSAT 9 (L) 05/14/2024   FERRITIN 73 05/14/2024      Decreased hemoglobin and iron  store.  Recommend IV venofer  weekly x 4

## 2024-05-21 NOTE — Progress Notes (Signed)
 HEMATOLOGY-ONCOLOGY TeleHEALTH VISIT PROGRESS NOTE  I connected with Monique Luna on 05/21/2024  at  3:30 PM EST by video enabled telemedicine visit and verified that I am speaking with the correct Monique Luna using two identifiers. I discussed the limitations, risks, security and privacy concerns of performing an evaluation and management service by telemedicine and the availability of in-[place] appointments. The patient expressed understanding and agreed to proceed.   Other persons participating in the visit and their role in the encounter:  None  Patient's location: office Provider's location: office Chief Complaint: discuss results.    INTERVAL HISTORY Monique Luna is a 33 y.o. female who has above history reviewed by me today presents for follow up visit for management of iron  deficiency anemia.  She has no new complaints. She is no longer on Nuvaring.  During the interval she has had 2 miscarriages.  Menstrual period flow is normal.  Endorses some fatigue.   Review of Systems  Constitutional:  Positive for fatigue. Negative for appetite change, chills and fever.  HENT:   Negative for hearing loss and voice change.   Eyes:  Negative for eye problems.  Respiratory:  Negative for chest tightness and cough.   Cardiovascular:  Negative for chest pain.  Gastrointestinal:  Negative for abdominal distention, abdominal pain and blood in stool.  Endocrine: Negative for hot flashes.  Genitourinary:  Negative for difficulty urinating and frequency.   Musculoskeletal:  Negative for arthralgias.  Skin:  Negative for itching and rash.  Neurological:  Negative for extremity weakness.  Hematological:  Negative for adenopathy.  Psychiatric/Behavioral:  Negative for confusion.     Past Medical History:  Diagnosis Date   Urethral diverticulum    Past Surgical History:  Procedure Laterality Date   BREAST REDUCTION SURGERY Bilateral 03/24/2021   Procedure: MAMMARY REDUCTION  (BREAST);   Surgeon: Elisabeth Craig RAMAN, MD;  Location: [facility];  Service: Plastics;  Laterality: Bilateral;   CHOLECYSTECTOMY  2009   [facility]   COSMETIC SURGERY  03/24/2021   Breat reduction   TRANSTHORACIC ECHOCARDIOGRAM  11/21/2021   Technically difficult-poor echo windows.  Suboptimal apical and subcostal windows.  Definity contrast not used.  EF estimated at 60 to 65% no RWMA.  Normal diastolic pressures.  Normal RV size and function.  Only mild MR with no MVP.  Normal AOV.  Normal RAP.    Family History  Problem Relation Age of Onset   Hypertension Mother    Hypertension Father    Anemia Sister    Healthy Sister    Diabetes Maternal Grandmother    Migraines Maternal Grandmother    Heart disease Maternal Grandmother    Depression Maternal Grandmother    Anemia Maternal Grandmother    Asthma Maternal Aunt     Social History   Socioeconomic History   Marital status: Single    Spouse name: Not on file   Number of children: Not on file   Years of education: Not on file   Highest education level: Associate degree: academic program  Occupational History   Not on file  Tobacco Use   Smoking status: Never   Smokeless tobacco: Never  Vaping Use   Vaping status: Never Used  Substance and Sexual Activity   Alcohol use: Not Currently    Comment: Rarely   Drug use: No   Sexual activity: Yes    Birth control/protection: I.U.D.  Other Topics Concern   Not on file  Social History Narrative   Not on  file   Social Drivers of Health   Tobacco Use: Low Risk (05/21/2024)   Patient History    Smoking Tobacco Use: Never    Smokeless Tobacco Use: Never    Passive Exposure: Not on file  Financial Resource Strain: Low Risk (03/24/2024)   Overall Financial Resource Strain (CARDIA)    Difficulty of Paying Living Expenses: Not hard at all  Food Insecurity: No Food Insecurity (03/24/2024)   Epic    Worried About Programme Researcher, Broadcasting/film/video in the Last Year: Never true    Ran Out  of Food in the Last Year: Never true  Transportation Needs: No Transportation Needs (03/24/2024)   Epic    Lack of Transportation (Medical): No    Lack of Transportation (Non-Medical): No  Physical Activity: Insufficiently Active (03/24/2024)   Exercise Vital Sign    Days of Exercise per Week: 4 days    Minutes of Exercise per Session: 10 min  Stress: No Stress Concern Present (03/24/2024)   [employer] of Occupational Health - Occupational Stress Questionnaire    Feeling of Stress: Not at all  Social Connections: Socially Isolated (03/24/2024)   Social Connection and Isolation Panel    Frequency of Communication with Friends and Family: More than three times a week    Frequency of Social Gatherings with Friends and Family: Once a week    Attends Religious Services: Never    Database Administrator or Organizations: No    Attends Engineer, Structural: Not on file    Marital Status: Never married  Intimate Partner Violence: Not on file  Depression (PHQ2-9): Low Risk (03/24/2024)   Depression (PHQ2-9)    PHQ-2 Score: 0  Alcohol Screen: Not on file  Housing: Unknown (03/24/2024)   Epic    Unable to Pay for Housing in the Last Year: No    Number of Times Moved in the Last Year: Not on file    Homeless in the Last Year: No  Utilities: Not At Risk (11/27/2023)   Received from [facility]   Epic    In the past 12 months has the electric, gas, oil, or water company threatened to shut off services in your home?: No  Health Literacy: Not on file    Current Outpatient Medications on File Prior to Visit  Medication Sig Dispense Refill   Cholecalciferol  (VITAMIN D ) 50 MCG (2000 UT) tablet Take 1 tablet (2,000 Units total) by mouth daily for vitamin D  deficiency 100 tablet 3   hydrOXYzine (ATARAX) 10 MG tablet Take 10 mg by mouth every 6 (six) hours as needed.     sertraline  (ZOLOFT ) 50 MG tablet Take 1 tablet (50 mg total) by mouth daily for mood. 90 tablet 3    traZODone  (DESYREL ) 150 MG tablet Take 150 mg by mouth at bedtime.     Ferrous Sulfate  (IRON  PO) Take 1 tablet by mouth daily. (Patient not taking: Reported on 05/21/2024)     valACYclovir  (VALTREX ) 1000 MG tablet Take 1 tablet (1,000 mg total) by mouth daily. Can take 1/2 pill daily for suppression after flare resolves (Patient not taking: Reported on 05/21/2024) 30 tablet 5   No current facility-administered medications on file prior to visit.    Allergies  Allergen Reactions   Wellbutrin  [Bupropion ]     Blurred vision, muscle spasms. Sensitivity to light       Observations/Objective: There were no vitals filed for this visit.  There is no height or weight on file to  calculate BMI.  Physical Exam Neurological:     Mental Status: She is alert.      Labs    Latest Ref Rng & Units 05/14/2024   11:48 AM 04/13/2024    9:45 AM 05/02/2023   11:26 AM  CBC  WBC 4.0 - 10.5 K/uL 5.2  6.3  6.0   Hemoglobin 12.0 - 15.0 g/dL 89.0  88.3  88.7   Hematocrit 36.0 - 46.0 % 35.8  38.6  34.9   Platelets 150 - 400 K/uL 255  289  234       Latest Ref Rng & Units 02/22/2022   10:02 PM 12/27/2021    4:06 PM  CMP  Glucose 70 - 99 mg/dL 891  94   BUN 6 - 20 mg/dL 15  10   Creatinine 9.55 - 1.00 mg/dL 8.98  9.19   Sodium 864 - 145 mmol/L 141  137   Potassium 3.5 - 5.1 mmol/L 3.6  3.9   Chloride 98 - 111 mmol/L 104  105   CO2 22 - 32 mmol/L 28  29   Calcium 8.9 - 10.3 mg/dL 9.1  8.7   Total Protein 6.5 - 8.1 g/dL  7.6   Total Bilirubin 0.3 - 1.2 mg/dL  0.4   Alkaline Phos 38 - 126 U/L  81   AST 15 - 41 U/L  15   ALT 0 - 44 U/L  11    Lab Results  Component Value Date   IRON  31 05/14/2024   TIBC 354 05/14/2024   FERRITIN 73 05/14/2024   ASSESSMENT & PLAN:   IDA (iron  deficiency anemia) Labs are reviewed and discussed with patient. Lab Results  Component Value Date   HGB 10.9 (L) 05/14/2024   TIBC 354 05/14/2024   IRONPCTSAT 9 (L) 05/14/2024   FERRITIN 73 05/14/2024       Decreased hemoglobin and iron  store.  Recommend IV venofer  weekly x 4   Alpha thalassemia minor Discussed with patient that Alpha thalassemia minor is a genetic condition where a [place] has one or two mutated genes for alpha globin, leading to mild anemia but usually no serious health issues.  suggest  genetic counseling before plan of conceiving.   Follow up 1 year lab MD virtual visit per patient' preference All questions were answered. The patient knows to call the clinic with any problems, questions or concerns.  Zelphia Cap, MD, PhD [facility] Hematology Oncology 05/21/2024

## 2024-05-27 ENCOUNTER — Other Ambulatory Visit: Payer: Self-pay

## 2024-05-27 DIAGNOSIS — D508 Other iron deficiency anemias: Secondary | ICD-10-CM

## 2024-05-28 ENCOUNTER — Inpatient Hospital Stay

## 2024-05-28 VITALS — BP 113/61 | HR 70 | Temp 97.4°F | Resp 16

## 2024-05-28 DIAGNOSIS — D508 Other iron deficiency anemias: Secondary | ICD-10-CM

## 2024-05-28 DIAGNOSIS — D509 Iron deficiency anemia, unspecified: Secondary | ICD-10-CM | POA: Diagnosis not present

## 2024-05-28 LAB — PREGNANCY, URINE: Preg Test, Ur: NEGATIVE

## 2024-05-28 MED ORDER — IRON SUCROSE 20 MG/ML IV SOLN
200.0000 mg | Freq: Once | INTRAVENOUS | Status: AC
  Administered 2024-05-28: 16:00:00 200 mg via INTRAVENOUS
  Filled 2024-05-28: qty 10

## 2024-05-28 NOTE — Patient Instructions (Signed)

## 2024-05-29 DIAGNOSIS — F4323 Adjustment disorder with mixed anxiety and depressed mood: Secondary | ICD-10-CM | POA: Diagnosis not present

## 2024-06-02 ENCOUNTER — Inpatient Hospital Stay

## 2024-06-02 VITALS — BP 109/76 | HR 78 | Resp 16

## 2024-06-02 DIAGNOSIS — D508 Other iron deficiency anemias: Secondary | ICD-10-CM

## 2024-06-02 DIAGNOSIS — D509 Iron deficiency anemia, unspecified: Secondary | ICD-10-CM | POA: Diagnosis not present

## 2024-06-02 LAB — PREGNANCY, URINE: Preg Test, Ur: NEGATIVE

## 2024-06-02 MED ORDER — IRON SUCROSE 20 MG/ML IV SOLN
200.0000 mg | Freq: Once | INTRAVENOUS | Status: AC
  Administered 2024-06-02: 200 mg via INTRAVENOUS
  Filled 2024-06-02: qty 10

## 2024-06-09 ENCOUNTER — Inpatient Hospital Stay

## 2024-06-09 VITALS — BP 138/70 | HR 85 | Temp 97.6°F | Resp 18

## 2024-06-09 DIAGNOSIS — D508 Other iron deficiency anemias: Secondary | ICD-10-CM

## 2024-06-09 DIAGNOSIS — D509 Iron deficiency anemia, unspecified: Secondary | ICD-10-CM | POA: Diagnosis not present

## 2024-06-09 LAB — PREGNANCY, URINE: Preg Test, Ur: NEGATIVE

## 2024-06-09 MED ORDER — SODIUM CHLORIDE 0.9% FLUSH
10.0000 mL | Freq: Once | INTRAVENOUS | Status: AC | PRN
  Administered 2024-06-09: 10 mL
  Filled 2024-06-09: qty 10

## 2024-06-09 MED ORDER — IRON SUCROSE 20 MG/ML IV SOLN
200.0000 mg | Freq: Once | INTRAVENOUS | Status: AC
  Administered 2024-06-09: 200 mg via INTRAVENOUS
  Filled 2024-06-09: qty 10

## 2025-05-20 ENCOUNTER — Inpatient Hospital Stay

## 2025-05-27 ENCOUNTER — Inpatient Hospital Stay: Admitting: Oncology
